# Patient Record
Sex: Female | Born: 1990 | Race: Asian | Hispanic: No | Smoking: Former smoker
Health system: Southern US, Community
[De-identification: ages and names within clinical notes are randomized; demographics above are authoritative.]

## PROBLEM LIST (undated history)

## (undated) ENCOUNTER — Emergency Department (HOSPITAL_COMMUNITY): Discharge status: Absent without leave | Payer: Self-pay

## (undated) DIAGNOSIS — F332 Major depressive disorder, recurrent severe without psychotic features: Secondary | ICD-10-CM

## (undated) DIAGNOSIS — O24419 Gestational diabetes mellitus in pregnancy, unspecified control: Secondary | ICD-10-CM

## (undated) DIAGNOSIS — R569 Unspecified convulsions: Secondary | ICD-10-CM

## (undated) DIAGNOSIS — N809 Endometriosis, unspecified: Secondary | ICD-10-CM

## (undated) DIAGNOSIS — F1910 Other psychoactive substance abuse, uncomplicated: Secondary | ICD-10-CM

## (undated) DIAGNOSIS — F17200 Nicotine dependence, unspecified, uncomplicated: Secondary | ICD-10-CM

## (undated) DIAGNOSIS — F32A Depression, unspecified: Secondary | ICD-10-CM

## (undated) DIAGNOSIS — F419 Anxiety disorder, unspecified: Secondary | ICD-10-CM

## (undated) DIAGNOSIS — O139 Gestational [pregnancy-induced] hypertension without significant proteinuria, unspecified trimester: Secondary | ICD-10-CM

## (undated) DIAGNOSIS — F329 Major depressive disorder, single episode, unspecified: Secondary | ICD-10-CM

## (undated) HISTORY — DX: Gestational (pregnancy-induced) hypertension without significant proteinuria, unspecified trimester: O13.9

## (undated) HISTORY — DX: Gestational diabetes mellitus in pregnancy, unspecified control: O24.419

## (undated) HISTORY — DX: Major depressive disorder, recurrent severe without psychotic features: F33.2

---
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
## 2011-12-07 ENCOUNTER — Ambulatory Visit
Admission: RE | Admit: 2011-12-07 | Discharge: 2011-12-07 | Disposition: A | Discharge status: Home / Self Care | Payer: BC Managed Care – PPO | Source: Ambulatory Visit | Attending: Family Medicine | Admitting: Family Medicine

## 2011-12-07 ENCOUNTER — Other Ambulatory Visit: Payer: Self-pay | Admitting: Family Medicine

## 2011-12-08 ENCOUNTER — Emergency Department (HOSPITAL_COMMUNITY)
Admission: EM | Admit: 2011-12-08 | Discharge: 2011-12-09 | Disposition: A | Discharge status: Home / Self Care | Payer: BC Managed Care – PPO | Attending: Emergency Medicine | Admitting: Emergency Medicine

## 2011-12-08 ENCOUNTER — Encounter (HOSPITAL_COMMUNITY): Payer: Self-pay | Admitting: Emergency Medicine

## 2011-12-08 DIAGNOSIS — R109 Unspecified abdominal pain: Secondary | ICD-10-CM | POA: Insufficient documentation

## 2011-12-08 DIAGNOSIS — A499 Bacterial infection, unspecified: Secondary | ICD-10-CM | POA: Insufficient documentation

## 2011-12-08 DIAGNOSIS — B9689 Other specified bacterial agents as the cause of diseases classified elsewhere: Secondary | ICD-10-CM | POA: Insufficient documentation

## 2011-12-08 DIAGNOSIS — N949 Unspecified condition associated with female genital organs and menstrual cycle: Secondary | ICD-10-CM | POA: Insufficient documentation

## 2011-12-08 DIAGNOSIS — F17200 Nicotine dependence, unspecified, uncomplicated: Secondary | ICD-10-CM | POA: Insufficient documentation

## 2011-12-08 DIAGNOSIS — N760 Acute vaginitis: Secondary | ICD-10-CM | POA: Insufficient documentation

## 2011-12-08 DIAGNOSIS — J45909 Unspecified asthma, uncomplicated: Secondary | ICD-10-CM | POA: Insufficient documentation

## 2011-12-08 DIAGNOSIS — Z79899 Other long term (current) drug therapy: Secondary | ICD-10-CM | POA: Insufficient documentation

## 2011-12-08 LAB — URINALYSIS, ROUTINE W REFLEX MICROSCOPIC
Bilirubin Urine: NEGATIVE
Glucose, UA: NEGATIVE mg/dL
Specific Gravity, Urine: 1.018 (ref 1.005–1.030)
pH: 8 (ref 5.0–8.0)

## 2011-12-08 LAB — BASIC METABOLIC PANEL
BUN: 10 mg/dL (ref 6–23)
GFR calc non Af Amer: >90 mL/min (ref >90)
Glucose, Bld: 87 mg/dL (ref 70–99)
Potassium: 3.6 mEq/L (ref 3.5–5.1)

## 2011-12-08 LAB — DIFFERENTIAL
Eosinophils Absolute: 0.5 10*3/uL (ref 0.0–0.7)
Eosinophils Relative: 5 % (ref 0–5)
Lymphs Abs: 4 10*3/uL (ref 0.7–4.0)
Monocytes Absolute: 0.6 10*3/uL (ref 0.1–1.0)
Monocytes Relative: 6 % (ref 3–12)
Neutrophils Relative %: 45 % (ref 43–77)

## 2011-12-08 LAB — URINE MICROSCOPIC-ADD ON

## 2011-12-08 LAB — CBC
HCT: 39.3 % (ref 36.0–46.0)
Hemoglobin: 12.7 g/dL (ref 12.0–15.0)
MCH: 26 pg (ref 26.0–34.0)
MCV: 80.4 fL (ref 78.0–100.0)
RBC: 4.89 MIL/uL (ref 3.87–5.11)

## 2011-12-08 NOTE — ED Notes (Signed)
PT. REPORTS LOW ABDOMINAL PAIN FOR 3 WEEKS WITH INTERMITTENT VOMITTING AND DIARRHEA , ULTRASOUND DONE 2 DAYS AGO AT [facility] WITH NO SIGNIFICANT FINDINGS , ALSO REPORTS TOOK SIX TRAMADOL TABS THIS EVENING WITH NO RELIEF.

## 2011-12-08 NOTE — ED Notes (Signed)
Patient moved to cdu 11  for pelvic exam.

## 2011-12-09 ENCOUNTER — Emergency Department (HOSPITAL_COMMUNITY): Payer: BC Managed Care – PPO

## 2011-12-09 LAB — WET PREP, GENITAL
Trich, Wet Prep: NONE SEEN
Yeast Wet Prep HPF POC: NONE SEEN

## 2011-12-09 MED ORDER — METRONIDAZOLE 500 MG PO TABS: 500.0000 mg | ORAL_TABLET | Freq: Two times a day (BID) | ORAL | Status: AC

## 2011-12-09 MED ORDER — ONDANSETRON HCL 4 MG/2ML IJ SOLN
4.0000 mg | Freq: Once | INTRAMUSCULAR | Status: AC
  Administered 2011-12-09: 4 mg via INTRAVENOUS
  Filled 2011-12-09: qty 2

## 2011-12-09 NOTE — Discharge Instructions (Signed)
Please review the instructions below. Your blood work, urinalysis and a pelvic ultrasound tonight were all normal. The test performed during the pelvic exam does reveal that you have a bacterial vaginosis. We are treating this infection with an antibiotic, please take as directed and be sure complete. You'll need to arrange follow up with Dr. Hyacinth Gaines for further evaluation of this persistent lower abdominal pain. Be sure to let her know you have seen in the emergency department and had the pelvic ultrasound which was without acute findings. Return for worsening symptoms otherwise follow up as discussed.  Abdominal Pain Many things can cause belly (abdominal) pain. Most times, the belly pain is not dangerous. The amount of belly pain does not tell how serious the problem may be. Many cases of belly pain can be watched and treated at home. HOME CARE   Do not take medicines that help you go poop (laxatives) unless told to by your doctor.   Only take medicine as told by your doctor.   Eat or drink as told by your doctor. Your doctor will tell you if you should be on a special diet.  GET HELP RIGHT AWAY IF:   The pain does not go away.   You have a fever.   You keep throwing up (vomiting).   The pain changes and is only in the right or left part of the belly.   You have bloody or tarry looking poop.  MAKE SURE YOU:   Understand these instructions.   Will watch your condition.   Will get help right away if you are not doing well or get worse.  Document Released: 03/07/2008 Document Revised: 09/08/2011 Document Reviewed: 10/05/2009 [facility] Patient Information 2012 [place], [place].Bacterial Vaginosis Bacterial vaginosis is an infection of the vagina. A healthy vagina has many kinds of good germs (bacteria). Sometimes the number of good germs can change. This allows bad germs to move in and cause an infection. You may be given medicine (antibiotics) to treat the infection. Or, you may not need  treatment at all. HOME CARE  Take your medicine as told. Finish them even if you start to feel better.   Do not have sex until you finish your medicine.   Do not douche.   Practice safe sex.   Tell your sex partner that you have an infection. They should see their doctor for treatment if they have problems.  GET HELP RIGHT AWAY IF:  You do not get better after 3 days of treatment.   You have grey fluid (discharge) coming from your vagina.   You have pain.   You have a temperature of 102 F (38.9 C) or higher.  MAKE SURE YOU:   Understand these instructions.   Will watch your condition.   Will get help right away if you are not doing well or get worse.  Document Released: 06/28/2008 Document Revised: 09/08/2011 Document Reviewed: 06/28/2008 [facility] Patient Information 2012 [place], [place].Bacterial Vaginosis Bacterial vaginosis is an infection of the vagina. A healthy vagina has many kinds of good germs (bacteria). Sometimes the number of good germs can change. This allows bad germs to move in and cause an infection. You may be given medicine (antibiotics) to treat the infection. Or, you may not need treatment at all. HOME CARE  Take your medicine as told. Finish them even if you start to feel better.   Do not have sex until you finish your medicine.   Do not douche.   Practice safe sex.  Tell your sex partner that you have an infection. They should see their doctor for treatment if they have problems.  GET HELP RIGHT AWAY IF:  You do not get better after 3 days of treatment.   You have grey fluid (discharge) coming from your vagina.   You have pain.   You have a temperature of 102 F (38.9 C) or higher.  MAKE SURE YOU:   Understand these instructions.   Will watch your condition.   Will get help right away if you are not doing well or get worse.  Document Released: 06/28/2008 Document Revised: 09/08/2011 Document Reviewed: 06/28/2008 [facility] Patient  Information 2012 [place], [place].

## 2011-12-09 NOTE — ED Provider Notes (Signed)
History     CSN: [id number]  Arrival date & time 12/08/11  2055   First MD Initiated Contact with Patient 12/08/11 2210      Chief Complaint  Patient presents with  . Abdominal Pain     Patient is a 21 y.o. female presenting with abdominal pain. The history is provided by the patient.  Abdominal Pain The primary symptoms of the illness include abdominal pain. The primary symptoms of the illness do not include fever, shortness of breath, nausea, vomiting, diarrhea, hematemesis, hematochezia, dysuria, vaginal discharge or vaginal bleeding. The current episode started more than 2 days ago. The onset of the illness was gradual. The problem has been gradually worsening.  The patient states that she believes she is currently not pregnant. The patient has not had a change in bowel habit. Symptoms associated with the illness do not include chills, anorexia, constipation, urgency, frequency or back pain.   patient reports to 3 week history of lower abdominal pain. States the pain is intermittent and severe at times. States the worst pain is in the suprapubic area but then she has sharp pains that radiate up to her mid abdomen. Denies nausea, vomiting, diarrhea, fever, vaginal discharge or urinary tract symptoms. Was evaluated by her PCP recently for this complaint and she states it were an abdominal ultrasound which was normal. Patient states she is unsure why they ordered an abdominal ultrasound when her pain has clearly been lower abdomen/pelvic area. Patient admits to being sexually active with one partner, and intercourse is often a protected. States she is on oral contraceptives.   Past Medical History  Diagnosis Date  . Asthma     History reviewed. No pertinent past surgical history.  No family history on file.  History  Substance Use Topics  . Smoking status: Current Everyday Smoker  . Smokeless tobacco: Not on file  . Alcohol Use: No    OB History    Grav Para Term Preterm  Abortions TAB SAB Ect Mult Living                  Review of Systems  Constitutional: Negative.  Negative for fever and chills.  HENT: Negative.   Eyes: Negative.   Respiratory: Negative.  Negative for shortness of breath.   Cardiovascular: Negative.   Gastrointestinal: Positive for abdominal pain. Negative for nausea, vomiting, diarrhea, constipation, hematochezia, anorexia and hematemesis.  Genitourinary: Negative.  Negative for dysuria, urgency, frequency, vaginal bleeding and vaginal discharge.  Musculoskeletal: Negative.  Negative for back pain.  Skin: Negative.   Neurological: Negative.   Hematological: Negative.   Psychiatric/Behavioral: Negative.     Allergies  Albuterol  Home Medications   Current Outpatient Rx  Name Route Sig Dispense Refill  . TRAMADOL HCL 50 MG PO TABS Oral Take 50-100 mg by mouth every 8 (eight) hours as needed. For pain    . METRONIDAZOLE 500 MG PO TABS Oral Take 1 tablet (500 mg total) by mouth 2 (two) times daily. 14 tablet 0    BP 102/57  Pulse 65  Temp(Src) 98.3 F (36.8 C) (Oral)  Resp 18  SpO2 98%  LMP 12/04/2011  Physical Exam  Constitutional: She is oriented to person, place, and time. She appears well-developed and well-nourished.  HENT:  Head: Normocephalic and atraumatic.  Eyes: Conjunctivae are normal.  Neck: Neck supple.  Cardiovascular: Normal rate and regular rhythm.   Pulmonary/Chest: Effort normal and breath sounds normal.  Abdominal: Soft. Bowel sounds are normal. Hernia confirmed negative  in the right inguinal area and confirmed negative in the left inguinal area.  Genitourinary: Vagina normal and uterus normal. There is no rash or tenderness on the right labia. There is no rash or tenderness on the left labia. Cervix exhibits no motion tenderness, no discharge and no friability. Right adnexum displays no mass, no tenderness and no fullness. Left adnexum displays no mass, no tenderness and no fullness.       Mild (L)  adnexal TTP, no fullness or mass. Creamy white vag d/c.  Musculoskeletal: Normal range of motion.  Lymphadenopathy:       Right: No inguinal adenopathy present.       Left: No inguinal adenopathy present.  Neurological: She is alert and oriented to person, place, and time.  Skin: Skin is warm and dry. No erythema.  Psychiatric: She has a normal mood and affect.    ED Course  Procedures   Patient noted to be sleeping upon reassessment. Findings and clinical impression discussed with patient. Will plan to treat for BV and encourage close follow up with her PCP for further evaluation of persistent lower abdominal/pelvic pain. Pt is agreeable w/ plan.  Labs Reviewed  URINALYSIS, ROUTINE W REFLEX MICROSCOPIC - Abnormal; Notable for the following:    APPearance TURBID (*)    Hgb urine dipstick SMALL (*)    Leukocytes, UA TRACE (*)    All other components within normal limits  URINE MICROSCOPIC-ADD ON - Abnormal; Notable for the following:    Squamous Epithelial / LPF FEW (*)    Bacteria, UA FEW (*)    All other components within normal limits  WET PREP, GENITAL - Abnormal; Notable for the following:    Clue Cells Wet Prep HPF POC FEW (*)    WBC, Wet Prep HPF POC MANY (*)    All other components within normal limits  CBC  DIFFERENTIAL  BASIC METABOLIC PANEL  POCT PREGNANCY, URINE  GC/CHLAMYDIA PROBE AMP, GENITAL   US Abdomen Complete  12/07/2011  *RADIOLOGY REPORT*  Clinical Data:  Abdominal pain for 3 weeks, smoking history  COMPLETE ABDOMINAL ULTRASOUND  Comparison:  None.  Findings:  Gallbladder:  The gallbladder is visualized and no gallstones are noted.  There is no pain over the gallbladder with compression.  Common bile duct:  The common bile duct is normal measuring 2.3 mm in diameter.  Liver:  The liver has a normal echogenic pattern.  No ductal dilatation is seen.  IVC:  Appears normal.  Pancreas:  No focal abnormality seen.  Spleen:  The spleen is normal measuring 6.4 cm  sagittally.  Right Kidney:  No hydronephrosis is seen.  The right kidney measures 10.6 cm sagittally.  Left Kidney:  No hydronephrosis.  The left kidney measures 11.6 cm.  Abdominal aorta:  The abdominal aorta is normal in caliber.  IMPRESSION: Negative abdominal ultrasound.  No gallstones.  No ductal dilatation.  Original Report Authenticated By: Juline Patch, M.D.   US Transvaginal Non-ob  12/09/2011  *RADIOLOGY REPORT*  Clinical Data: Abdominal pain  TRANSABDOMINAL AND TRANSVAGINAL ULTRASOUND OF PELVIS Technique:  Both transabdominal and transvaginal ultrasound examinations of the pelvis were performed. Transabdominal technique was performed for global imaging of the pelvis including uterus, ovaries, adnexal regions, and pelvic cul-de-sac.  Comparison: None   It was necessary to proceed with endovaginal exam following the transabdominal exam to visualize the endometrium and ovaries.  Findings:  Uterus: 6.2 cm length by 3.3 cm AP by 4.4 cm transverse.  Appears anteflexed.  No gross mass identified.  Endometrium: 3 mm thick, normal.  No endometrial fluid.  Right ovary:  5.4 x 2.7 x 2.7 cm.  Normal morphology without mass.  Left ovary: 4.6 x 2.3 x 3.4 cm.  Normal morphology without mass.  Other findings: Small to moderate amount of simple appearing free pelvic fluid.  No adnexal masses.  IMPRESSION: Anteflexed uterus. Small to moderate amount of simple appearing free pelvic fluid. No additional intrapelvic abnormalities identified.  Original Report Authenticated By: Lollie Marrow, M.D.   US Pelvis Complete  12/09/2011  *RADIOLOGY REPORT*  Clinical Data: Abdominal pain  TRANSABDOMINAL AND TRANSVAGINAL ULTRASOUND OF PELVIS Technique:  Both transabdominal and transvaginal ultrasound examinations of the pelvis were performed. Transabdominal technique was performed for global imaging of the pelvis including uterus, ovaries, adnexal regions, and pelvic cul-de-sac.  Comparison: None   It was necessary to proceed with  endovaginal exam following the transabdominal exam to visualize the endometrium and ovaries.  Findings:  Uterus: 6.2 cm length by 3.3 cm AP by 4.4 cm transverse.  Appears anteflexed.  No gross mass identified.  Endometrium: 3 mm thick, normal.  No endometrial fluid.  Right ovary:  5.4 x 2.7 x 2.7 cm.  Normal morphology without mass.  Left ovary: 4.6 x 2.3 x 3.4 cm.  Normal morphology without mass.  Other findings: Small to moderate amount of simple appearing free pelvic fluid.  No adnexal masses.  IMPRESSION: Anteflexed uterus. Small to moderate amount of simple appearing free pelvic fluid. No additional intrapelvic abnormalities identified.  Original Report Authenticated By: Lollie Marrow, M.D.     1. Bacterial vaginosis   2. Abdominal pain       MDM  HPI/PE and clinical findings c/w 1. Abd pain (PE unremarkable, never observed in any acute distress, abd exam unremarkable. Mild SP TTP. All labs and pelvic u/s normal. Pelvic (wet prep) shows BV but otherwise unremarkable.  2. Bacterial Vaginosis        Roma Kayser Kinsey Karch, NP 12/09/11 [phone]

## 2011-12-09 NOTE — ED Notes (Signed)
Called [place].  They will be coming to transport pt for pelvic US in 15 minutes.

## 2011-12-10 LAB — GC/CHLAMYDIA PROBE AMP, GENITAL
Chlamydia, DNA Probe: POSITIVE — AB
GC Probe Amp, Genital: NEGATIVE

## 2011-12-11 NOTE — ED Notes (Signed)
+  Chlamydia Chart sent to EDP office for review.  

## 2011-12-12 NOTE — ED Notes (Signed)
Rx for Zithromax 1 gram po (tablets ok per Lyman Bishop.

## 2011-12-13 NOTE — ED Notes (Addendum)
Patient returned call and was informed of positive results after id'd x 2 and informed of need to notify partner to be treated. Rx called to [facility] by [facility] letter faxed.

## 2011-12-13 NOTE — ED Notes (Signed)
No answer

## 2011-12-29 NOTE — ED Provider Notes (Signed)
Evaluation and management procedures were performed by the PA/NP under my supervision/collaboration.   Felisa Bonier, MD 12/29/11 1009

## 2014-08-25 ENCOUNTER — Emergency Department (HOSPITAL_COMMUNITY)
Admission: EM | Admit: 2014-08-25 | Discharge: 2014-08-25 | Disposition: A | Discharge status: Home / Self Care | Payer: Self-pay | Attending: Emergency Medicine | Admitting: Emergency Medicine

## 2014-08-25 ENCOUNTER — Encounter (HOSPITAL_COMMUNITY): Payer: Self-pay | Admitting: Emergency Medicine

## 2014-08-25 DIAGNOSIS — Z720 Tobacco use: Secondary | ICD-10-CM | POA: Insufficient documentation

## 2014-08-25 DIAGNOSIS — R0682 Tachypnea, not elsewhere classified: Secondary | ICD-10-CM | POA: Insufficient documentation

## 2014-08-25 DIAGNOSIS — Z3202 Encounter for pregnancy test, result negative: Secondary | ICD-10-CM | POA: Insufficient documentation

## 2014-08-25 DIAGNOSIS — F419 Anxiety disorder, unspecified: Secondary | ICD-10-CM | POA: Insufficient documentation

## 2014-08-25 LAB — URINALYSIS, ROUTINE W REFLEX MICROSCOPIC
Bilirubin Urine: NEGATIVE
Glucose, UA: NEGATIVE mg/dL
Hgb urine dipstick: NEGATIVE
Ketones, ur: NEGATIVE mg/dL
Leukocytes, UA: NEGATIVE
Nitrite: NEGATIVE
Protein, ur: NEGATIVE mg/dL
Specific Gravity, Urine: 1.011 (ref 1.005–1.030)
Urobilinogen, UA: 0.2 mg/dL (ref 0.0–1.0)
pH: 8 (ref 5.0–8.0)

## 2014-08-25 LAB — PREGNANCY, URINE: Preg Test, Ur: NEGATIVE

## 2014-08-25 LAB — CBG MONITORING, ED: Glucose-Capillary: 78 mg/dL (ref 70–99)

## 2014-08-25 MED ORDER — LORAZEPAM 1 MG PO TABS
1.0000 mg | ORAL_TABLET | Freq: Once | ORAL | Status: AC
  Administered 2014-08-25: 1 mg via ORAL
  Filled 2014-08-25: qty 1

## 2014-08-25 MED ORDER — ONDANSETRON 4 MG PO TBDP
4.0000 mg | ORAL_TABLET | Freq: Once | ORAL | Status: AC
  Administered 2014-08-25: 4 mg via ORAL
  Filled 2014-08-25: qty 1

## 2014-08-25 NOTE — ED Notes (Signed)
Pt in room making self hyperventilate, vitals WDL. Pt told to take deep breathes in nose and out of mouth. Oxygen given for comfort.

## 2014-08-25 NOTE — ED Notes (Signed)
Pt states she feels like she is "going to have a seizure." Pt hyperventilating and sweating with extremity tingling. Pt states if she holds her breath sometimes the feeling goes away. Reports hx of seizures, has never been prescribed medication for seizures. Feeling began while at work today, denies any upsetting situation at work.

## 2014-08-25 NOTE — Discharge Instructions (Signed)

## 2014-09-03 NOTE — ED Provider Notes (Signed)
CSN: [id number]     Arrival date & time 08/25/14  1239 History   First MD Initiated Contact with Patient 08/25/14 1310     Chief Complaint  Patient presents with  . Tingling    feels like she's going to have a seizure     (Consider location/radiation/quality/duration/timing/severity/associated sxs/prior Treatment) HPI   23yF with feeling that "I'm going to have a seizure." Says she has had seizures before, but not on meds. Can't recall last time she had one. Onset of symptoms just before arrival while at work. Feel anxious/nervous. Hands numb. Denies pain. No SOB. No fever or chills. Denies acute stressors.   History reviewed. No pertinent past medical history. History reviewed. No pertinent past surgical history. History reviewed. No pertinent family history. History  Substance Use Topics  . Smoking status: Current Every Day Smoker  . Smokeless tobacco: Not on file  . Alcohol Use: No   OB History    No data available     Review of Systems  All systems reviewed and negative, other than as noted in HPI.   Allergies  Review of patient's allergies indicates no known allergies.  Home Medications   Prior to Admission medications   Medication Sig Start Date End Date Taking? Authorizing Provider  ibuprofen (ADVIL,MOTRIN) 200 MG tablet Take 200-400 mg by mouth every 6 (six) hours as needed for mild pain, moderate pain or cramping.   Yes Historical Provider, MD   BP 89/45 mmHg  Pulse 54  Temp(Src) 97.7 F (36.5 C) (Oral)  Resp 18  SpO2 98% Physical Exam  Constitutional: She is oriented to person, place, and time. She appears well-developed and well-nourished. No distress.  HENT:  Head: Normocephalic and atraumatic.  Eyes: Conjunctivae are normal. Right eye exhibits no discharge. Left eye exhibits no discharge.  Neck: Neck supple.  Cardiovascular: Normal rate, regular rhythm and normal heart sounds.  Exam reveals no gallop and no friction rub.   No murmur  heard. Pulmonary/Chest: Breath sounds normal.  Very tachypneic. When answering questions her breathing slows though and she can get out several sentences w/o pause.   Abdominal: Soft. She exhibits no distension. There is no tenderness.  Musculoskeletal: She exhibits no edema or tenderness.  Lower extremities symmetric as compared to each other. No calf tenderness. Negative Homan's. No palpable cords.   Neurological: She is alert and oriented to person, place, and time. No cranial nerve deficit. She exhibits normal muscle tone. Coordination normal.  Skin: Skin is warm and dry.  Psychiatric: She has a normal mood and affect. Her behavior is normal. Thought content normal.  Nursing note and vitals reviewed.   ED Course  Procedures (including critical care time) Labs Review Labs Reviewed  URINALYSIS, ROUTINE W REFLEX MICROSCOPIC  PREGNANCY, URINE  CBG MONITORING, ED    Imaging Review No results found.   EKG Interpretation   Date/Time:  Monday August 25 2014 14:00:41 EST Ventricular Rate:  55 PR Interval:  157 QRS Duration: 75 QT Interval:  461 QTC Calculation: 441 R Axis:   84 Text Interpretation:  Sinus rhythm ED PHYSICIAN INTERPRETATION AVAILABLE  IN [facility] Confirmed by TEST, Record ([id number]) on 08/27/2014 8:05:00  AM      MDM   Final diagnoses:  Anxiety    23yF with likely anxiety. Low suspicion for emergent pathology. NOnfocal neuro exam.     Raeford RazorStephen Danaly Bari, MD 09/03/14 1114

## 2015-04-15 ENCOUNTER — Encounter (HOSPITAL_COMMUNITY): Payer: Self-pay | Admitting: Emergency Medicine

## 2015-04-15 ENCOUNTER — Emergency Department (HOSPITAL_COMMUNITY)
Admission: EM | Admit: 2015-04-15 | Discharge: 2015-04-16 | Disposition: A | Discharge status: Home / Self Care | Payer: Self-pay | Attending: Emergency Medicine | Admitting: Emergency Medicine

## 2015-04-15 DIAGNOSIS — F1310 Sedative, hypnotic or anxiolytic abuse, uncomplicated: Secondary | ICD-10-CM | POA: Insufficient documentation

## 2015-04-15 DIAGNOSIS — Z8742 Personal history of other diseases of the female genital tract: Secondary | ICD-10-CM | POA: Insufficient documentation

## 2015-04-15 DIAGNOSIS — F329 Major depressive disorder, single episode, unspecified: Secondary | ICD-10-CM | POA: Insufficient documentation

## 2015-04-15 DIAGNOSIS — Z720 Tobacco use: Secondary | ICD-10-CM | POA: Insufficient documentation

## 2015-04-15 DIAGNOSIS — F419 Anxiety disorder, unspecified: Secondary | ICD-10-CM | POA: Insufficient documentation

## 2015-04-15 DIAGNOSIS — F32A Depression, unspecified: Secondary | ICD-10-CM

## 2015-04-15 DIAGNOSIS — F1210 Cannabis abuse, uncomplicated: Secondary | ICD-10-CM | POA: Insufficient documentation

## 2015-04-15 DIAGNOSIS — J45909 Unspecified asthma, uncomplicated: Secondary | ICD-10-CM | POA: Insufficient documentation

## 2015-04-15 HISTORY — DX: Endometriosis, unspecified: N80.9

## 2015-04-15 LAB — CBC WITH DIFFERENTIAL/PLATELET
BASOS ABS: 0 10*3/uL (ref 0.0–0.1)
Basophils Relative: 0 % (ref 0–1)
Eosinophils Absolute: 0.1 10*3/uL (ref 0.0–0.7)
Eosinophils Relative: 1 % (ref 0–5)
HCT: 38.8 % (ref 36.0–46.0)
HEMOGLOBIN: 12.9 g/dL (ref 12.0–15.0)
LYMPHS ABS: 3.8 10*3/uL (ref 0.7–4.0)
Lymphocytes Relative: 42 % (ref 12–46)
MCH: 26.7 pg (ref 26.0–34.0)
MCHC: 33.2 g/dL (ref 30.0–36.0)
MCV: 80.2 fL (ref 78.0–100.0)
MONOS PCT: 7 % (ref 3–12)
Monocytes Absolute: 0.6 10*3/uL (ref 0.1–1.0)
Neutro Abs: 4.4 10*3/uL (ref 1.7–7.7)
Neutrophils Relative %: 50 % (ref 43–77)
PLATELETS: 272 10*3/uL (ref 150–400)
RBC: 4.84 MIL/uL (ref 3.87–5.11)
RDW: 12.7 % (ref 11.5–15.5)
WBC: 8.9 10*3/uL (ref 4.0–10.5)

## 2015-04-15 LAB — RAPID URINE DRUG SCREEN, HOSP PERFORMED
Amphetamines: NOT DETECTED
BENZODIAZEPINES: POSITIVE — AB
Barbiturates: NOT DETECTED
Cocaine: NOT DETECTED
OPIATES: NOT DETECTED
Tetrahydrocannabinol: POSITIVE — AB

## 2015-04-15 LAB — COMPREHENSIVE METABOLIC PANEL
ALBUMIN: 4.1 g/dL (ref 3.5–5.0)
ALK PHOS: 40 U/L (ref 38–126)
ALT: 12 U/L — AB (ref 14–54)
ANION GAP: 7 (ref 5–15)
AST: 16 U/L (ref 15–41)
BUN: 14 mg/dL (ref 6–20)
CALCIUM: 9 mg/dL (ref 8.9–10.3)
CHLORIDE: 106 mmol/L (ref 101–111)
CO2: 23 mmol/L (ref 22–32)
CREATININE: 0.89 mg/dL (ref 0.44–1.00)
GFR calc Af Amer: >60 mL/min (ref >60)
Glucose, Bld: 129 mg/dL — ABNORMAL HIGH (ref 65–99)
Potassium: 3.1 mmol/L — ABNORMAL LOW (ref 3.5–5.1)
Sodium: 136 mmol/L (ref 135–145)
Total Bilirubin: 1 mg/dL (ref 0.3–1.2)
Total Protein: 6.8 g/dL (ref 6.5–8.1)

## 2015-04-15 LAB — ETHANOL

## 2015-04-15 MED ORDER — KETOROLAC TROMETHAMINE 30 MG/ML IJ SOLN
30.0000 mg | Freq: Once | INTRAMUSCULAR | Status: AC
  Administered 2015-04-15: 30 mg via INTRAVENOUS
  Filled 2015-04-15: qty 1

## 2015-04-15 MED ORDER — SODIUM CHLORIDE 0.9 % IV SOLN
1000.0000 mL | Freq: Once | INTRAVENOUS | Status: AC
  Administered 2015-04-15: 1000 mL via INTRAVENOUS

## 2015-04-15 MED ORDER — DIPHENHYDRAMINE HCL 50 MG/ML IJ SOLN
25.0000 mg | Freq: Once | INTRAMUSCULAR | Status: AC
  Administered 2015-04-15: 25 mg via INTRAVENOUS
  Filled 2015-04-15: qty 1

## 2015-04-15 MED ORDER — SODIUM CHLORIDE 0.9 % IV BOLUS (SEPSIS)
1000.0000 mL | Freq: Once | INTRAVENOUS | Status: AC
  Administered 2015-04-15: 1000 mL via INTRAVENOUS

## 2015-04-15 MED ORDER — SODIUM CHLORIDE 0.9 % IV SOLN
1000.0000 mL | INTRAVENOUS | Status: DC
  Administered 2015-04-15: 1000 mL via INTRAVENOUS

## 2015-04-15 MED ORDER — ALPRAZOLAM 0.25 MG PO TABS
2.0000 mg | ORAL_TABLET | Freq: Once | ORAL | Status: AC
Start: 2015-04-15 — End: 2015-04-15
  Administered 2015-04-15: 2 mg via ORAL
  Filled 2015-04-15: qty 8

## 2015-04-15 NOTE — ED Notes (Signed)
Pt continuing to complain of "shaky legs", "feeling cold" and "hurting all over".  MD made aware.  This RN did comforting measures with pt, including warm blanket, therapeutic conversation, and education.  Pt verbalizes understanding but it frustrated and stating she is considering leaving. This RN continued to encourage pt to stay strong and allow us to care for her.  Pt willing to stay at this time.

## 2015-04-15 NOTE — ED Notes (Signed)
MD at bedside. 

## 2015-04-15 NOTE — ED Notes (Signed)
Pt again threatening to leave.  Pt sts "it's too cold".  This RN has explained that patient cannot go outside while under our care.  Offered another warm blanket then MD arrived.

## 2015-04-15 NOTE — ED Notes (Signed)
Pt requesting more pain medications and "something to make me sleep".  This RN attempted to explain protocols and vital sign monitoring.  Pt unhappy with this RN's responses and requesting MD to see her.  MD made aware.

## 2015-04-15 NOTE — ED Provider Notes (Signed)
CSN: [id number]     Arrival date & time 04/15/15  1723 History   First MD Initiated Contact with Patient 04/15/15 1903     Chief Complaint  Patient presents with  . Anxiety  . Generalized Body Aches     (Consider location/radiation/quality/duration/timing/severity/associated sxs/prior Treatment) Patient is a 24 y.o. female presenting with anxiety. The history is provided by the patient.  Anxiety  She is a very poor historian but comes here mainly for help with detoxing off of Xanax. She states that she is taking 5 or 6 bars at day and has been doing so for the last several weeks. Once before, she had a seizure when she stopped the Xanax abruptly. She states that she is having a lot of stress related to a divorce and she just once to get detoxed so that she can cut on with her life. She admits to having used cocaine in the past but not recently. She denies any intravenous drug use. She is only a social drinker and states she smokes about half pack of cigarettes a day. She denies depression, suicidal ideation, homicidal ideation, hallucinations. She is having some intermittent numbness and she's worried that she is going to have another seizure. She is also given planning of generalized aching.  Past Medical History  Diagnosis Date  . Asthma   . Endometriosis    History reviewed. No pertinent past surgical history. No family history on file. History  Substance Use Topics  . Smoking status: Current Every Day Smoker -- 0.50 packs/day    Types: Cigarettes  . Smokeless tobacco: Not on file  . Alcohol Use: Yes     Comment: social   OB History    No data available     Review of Systems  All other systems reviewed and are negative.     Allergies  Albuterol  Home Medications   Prior to Admission medications   Medication Sig Start Date End Date Taking? Authorizing Provider  traMADol (ULTRAM) 50 MG tablet Take 50-100 mg by mouth every 8 (eight) hours as needed. For pain     Historical Provider, MD   BP 110/46 mmHg  Pulse 82  Temp(Src) 97.7 F (36.5 C) (Oral)  Resp 22  SpO2 100%  LMP 04/10/2015 Physical Exam  Nursing note and vitals reviewed.  24 year old female, resting comfortably and in no acute distress. Vital signs are significant for mild tachypnea. Oxygen saturation is 100%, which is normal. Head is normocephalic and atraumatic. PERRLA, EOMI. Oropharynx is clear. Neck is nontender and supple without adenopathy or JVD. Back is nontender and there is no CVA tenderness. Lungs are clear without rales, wheezes, or rhonchi. Chest is nontender. Heart has regular rate and rhythm without murmur. Abdomen is soft, flat, nontender without masses or hepatosplenomegaly and peristalsis is normoactive. Extremities have no cyanosis or edema, full range of motion is present. Skin is warm and dry without rash. Neurologic: Speech is normal but affect is somewhat flat, cranial nerves are intact, there are no motor or sensory deficits.  ED Course  Procedures (including critical care time) Labs Review Results for orders placed or performed during the hospital encounter of 12/08/11  Wet prep, genital  Result Value Ref Range   Yeast Wet Prep HPF POC NONE SEEN NONE SEEN   Trich, Wet Prep NONE SEEN NONE SEEN   Clue Cells Wet Prep HPF POC FEW (A) NONE SEEN   WBC, Wet Prep HPF POC MANY (A) NONE SEEN  Urinalysis, Routine w  reflex microscopic  Result Value Ref Range   Color, Urine YELLOW YELLOW   APPearance TURBID (A) CLEAR   Specific Gravity, Urine 1.018 1.005 - 1.030   pH 8.0 5.0 - 8.0   Glucose, UA NEGATIVE NEGATIVE mg/dL   Hgb urine dipstick SMALL (A) NEGATIVE   Bilirubin Urine NEGATIVE NEGATIVE   Ketones, ur NEGATIVE NEGATIVE mg/dL   Protein, ur NEGATIVE NEGATIVE mg/dL   Urobilinogen, UA 0.2 0.0 - 1.0 mg/dL   Nitrite NEGATIVE NEGATIVE   Leukocytes, UA TRACE (A) NEGATIVE  CBC  Result Value Ref Range   WBC 9.3 4.0 - 10.5 K/uL   RBC 4.89 3.87 - 5.11 MIL/uL    Hemoglobin 12.7 12.0 - 15.0 g/dL   HCT 40.9 81.1 - 91.4 %   MCV 80.4 78.0 - 100.0 fL   MCH 26.0 26.0 - 34.0 pg   MCHC 32.3 30.0 - 36.0 g/dL   RDW 78.2 95.6 - 21.3 %   Platelets 310 150 - 400 K/uL  Differential  Result Value Ref Range   Neutrophils Relative % 45 43 - 77 %   Neutro Abs 4.1 1.7 - 7.7 K/uL   Lymphocytes Relative 43 12 - 46 %   Lymphs Abs 4.0 0.7 - 4.0 K/uL   Monocytes Relative 6 3 - 12 %   Monocytes Absolute 0.6 0.1 - 1.0 K/uL   Eosinophils Relative 5 0 - 5 %   Eosinophils Absolute 0.5 0.0 - 0.7 K/uL   Basophils Relative 0 0 - 1 %   Basophils Absolute 0.0 0.0 - 0.1 K/uL  Basic metabolic panel  Result Value Ref Range   Sodium 140 135 - 145 mEq/L   Potassium 3.6 3.5 - 5.1 mEq/L   Chloride 105 96 - 112 mEq/L   CO2 26 19 - 32 mEq/L   Glucose, Bld 87 70 - 99 mg/dL   BUN 10 6 - 23 mg/dL   Creatinine, Ser 0.86 0.50 - 1.10 mg/dL   Calcium 9.3 8.4 - 57.8 mg/dL   GFR calc non Af Amer >90 >90 mL/min   GFR calc Af Amer >90 >90 mL/min  Urine microscopic-add on  Result Value Ref Range   Squamous Epithelial / LPF FEW (A) RARE   WBC, UA 0-2 <3 WBC/hpf   RBC / HPF 0-2 <3 RBC/hpf   Bacteria, UA FEW (A) RARE   Urine-Other AMORPHOUS URATES/PHOSPHATES   GC/chlamydia probe amp, genital  Result Value Ref Range   GC Probe Amp, Genital NEGATIVE NEGATIVE   Chlamydia, DNA Probe POSITIVE (A) NEGATIVE  Pregnancy, urine POC  Result Value Ref Range   Preg Test, Ur NEGATIVE NEGATIVE    EKG Interpretation   Date/Time:  Wednesday April 15 2015 18:11:55 EDT Ventricular Rate:  61 PR Interval:  138 QRS Duration: 73 QT Interval:  412 QTC Calculation: 415 R Axis:   85 Text Interpretation:  Sinus rhythm Normal ECG No old tracing to compare  Confirmed by 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 Given her report of benzodiazepine  withdrawal seizure in the past, she will need to have her alprazolam weaned off over a 1-2 week period. Screening labs have been obtained.  Drug screen is positive for THC and benzodiazepine consistent with her history. After further discussion with patient, she actually does admit to being depressed and states she needs something  to get her motivated in the morning. She is given referrals to outpatient substance abuse programs and mental health services and is given a prescription for a tapering dose of alprazolam so that she can safely stop taking it. Because of complaints of depression, she is given a prescription for paroxetine. She is to follow up with her PCP in one week. She is advised to never take any medication that is not prescribed by physician, and to only take the medication exactly as prescribed.  Dione Booze, MD 04/16/15 [phone]

## 2015-04-15 NOTE — ED Notes (Signed)
Pt arrived to ED with sister and c/o generalized body aches, shaking and anxiety. Pt stated that she took multiple xanax "and something else I don't know what it was" and stated that it was not her prescriptions. Pt is very anxious and tearful.

## 2015-04-16 MED ORDER — ALPRAZOLAM 2 MG PO TABS: 2.0000 mg | ORAL_TABLET | ORAL | Status: DC

## 2015-04-16 MED ORDER — PAROXETINE HCL 20 MG PO TABS: 20.0000 mg | ORAL_TABLET | ORAL | Status: DC

## 2015-04-16 NOTE — Discharge Instructions (Signed)
Take the alprazolam (Xanax) EXACTLY as prescribed. This is a tapering dose designed to let you stop taking it safely. DO NOT TAKE ANY MEDICATION THAT IS NOT PRESCRIBED BY A DOCTOR!!   Emergency Department Resource Guide  Behavioral Health Resources in the Community: Intensive Outpatient Programs Organization         Address  Phone  Notes  [facility] [address] [place], [place] [phone]   [facility] Outpatient [address], [place], [place] [phone]   [facility] [address] [place], [place]  [phone]   [facility] [address]  [place], [place] [phone] or [phone]   Substance Abuse Resources Organization         Address  Phone  Notes  [facility]  [phone]   [facility]  [phone]   [facility]  [phone]   Floydene Flock  [phone]   Residential [facility] Outpatient Substance Abuse Program  [phone]   Psychological Services Organization         Address  Phone  Notes  [facility]  [phone]   [facility]  [phone]   [facility] [address] [place] [phone] or [phone]    Mobile Crisis Teams Organization         Address  Phone  Notes  [facility], Mobile Crisis Care Unit  [phone]   [facility]  [address] [place], [place] [phone]   Doristine Locks [address] [place] [phone]    Self-Help/Support Groups Organization         Address  Phone             Notes  [facility] [place] - variety of support groups  [phone] Call for more information  Narcotics Anonymous (NA), [facility] [address], [employer] [place]  2 meetings at this location   Statistician         Address  Phone  Notes  [facility] [address],      [place]  [phone]   [facility]  [address] [place] [phone], [place], [place] [phone]   [facility] [address] [place], [place] [phone] Admissions: 8am-3pm M-F  [facility] [address]    [place], [place] [phone]   [facility] [address] Starling Manns [place], [place] [phone]   [facility] [address],  [place], [place] [phone]   [facility] - Intensive Outpatient [address], [place], [place] [phone]   [facility] ([facility] [address]  [place], [place] [phone] or [phone]   [facility] ([facility] [address], [place], [place] [phone] Accepts Medicaid  [facility] [address],  [place] [phone] Substance Abuse/Addiction Treatment   Chemical Dependency Chemical dependency is an addiction to drugs or alcohol. It is characterized by the repeated behavior of seeking out and using drugs and alcohol despite harmful consequences to the health and safety of ones self and others.  RISK FACTORS There are certain situations or behaviors that increase a person's risk for chemical dependency. These include: 1. A family history of chemical dependency. 2. A history of mental health issues, including depression and anxiety. 3. A home environment where drugs and alcohol are easily available to you. 4. Drug or  alcohol use at a young age. SYMPTOMS  The following symptoms can indicate chemical dependency:  Inability to limit the use of drugs or alcohol.  Nausea, sweating, shakiness, and anxiety that occurs when alcohol or drugs are not being used.  An increase in amount of drugs or alcohol that is necessary to get drunk or high. People who experience these symptoms can assess their use of drugs and alcohol by asking themselves the following questions:  Have you been told by friends or  family that they are worried about your use of alcohol or drugs?  Do friends and family ever tell you about things you did while drinking alcohol or using drugs that you do not remember?  Do you lie about using alcohol or drugs or about the amounts you use?  Do you have difficulty completing daily tasks unless you use alcohol or drugs?  Is the level of your work or school performance lower because of your drug or alcohol use?  Do you get sick from using drugs or alcohol but keep using anyway?  Do you feel uncomfortable in social situations unless you use alcohol or drugs?  Do you use drugs or alcohol to help forget problems? An answer of yes to any of these questions may indicate chemical dependency. Professional evaluation is suggested. Document Released: 09/13/2001 Document Revised: 12/12/2011 Document Reviewed: 11/25/2010 [facility] Patient Information 2015 [place], [place]. This information is not intended to replace advice given to you by your health care provider. Make sure you discuss any questions you have with your health care provider.   Benzodiazepine Withdrawal  Benzodiazepines are a group of drugs that are prescribed for both short-term and long-term treatment of a variety of medical conditions. For some of these conditions, such as seizures and sudden and severe muscle spasms, they are used only for a few hours or a few days. For other conditions, such as anxiety, sleep problems, or frequent muscle spasms or to help prevent seizures, they are used for an extended period, usually weeks or months. Benzodiazepines work by changing the way your brain functions. Normally, chemicals in your brain called neurotransmitters send messages between your brain cells. The neurotransmitter that benzodiazepines affect is called gamma-aminobutyric acid (GABA). GABA sends out messages that have a calming effect on many of the functions of your brain. Benzodiazepines make these messages stronger and  increase this calming effect. Short-term use of benzodiazepines usually does not cause problems when you stop taking the drugs. However, if you take benzodiazepines for a long time, your body can adjust to the drug and require more of it to produce the same effect (drug tolerance). Eventually, you can develop physical dependence on benzodiazepines, which is when you experience negative effects if your dosage of benzodiazepines is reduced or stopped too quickly. These negative effects are called symptoms of withdrawal. SYMPTOMS Symptoms of withdrawal may begin anytime within the first 10 days after you stop taking the benzodiazepine. They can last from several weeks up to a few months but usually are the worst between the first 10 to 14 days.  The actual symptoms also vary, depending on the type of benzodiazepine you take. Possible symptoms include: 5. Anxiety. 6. Excitability. 7. Irritability. 8. Depression. 9. Mood swings. 10. Trouble sleeping. 11. Confusion. 12. Uncontrollable shaking (tremors). 13. Muscle weakness. 14. Seizures. DIAGNOSIS To diagnose benzodiazepine withdrawal, your caregiver will examine you for certain signs, such as:  Rapid heartbeat.  Rapid breathing.  Tremors.  High blood pressure.  Fever.  Mood changes.  Your caregiver also may ask the following questions about your use of benzodiazepines:  What type of benzodiazepine did you take?  How much did you take each day?  How long did you take the drug?  When was the last time you took the drug?  Do you take any other drugs?  Have you had alcohol recently?  Have you had a seizure recently?  Have you lost consciousness recently?  Have you had trouble remembering recent events?  Have you had a recent increase in anxiety, irritability, or trouble sleeping? A drug test also may be administered. TREATMENT The treatment for benzodiazepine withdrawal can vary, depending on the type and severity of your  symptoms, what type of benzodiazepine you have been taking, and how long you have been taking the benzodiazepine. Sometimes it is necessary for you to be treated in a hospital, especially if you are at risk of seizures.  Often, treatment includes a prescription for a long-acting benzodiazepine, the dosage of which is reduced slowly over a long period. This period could be several weeks or months. Eventually, your dosage will be reduced to a point that you can stop taking the drug, without experiencing withdrawal symptoms. This is called tapered withdrawal. Occasionally, minor symptoms of withdrawal continue for a few days or weeks after you have completed a tapered withdrawal. SEEK IMMEDIATE MEDICAL CARE IF:  You have a seizure.  You develop a craving for drugs or alcohol.  You begin to experience symptoms of withdrawal during your tapered withdrawal.  You become very confused.  You lose consciousness.  You have trouble breathing.  You think about hurting yourself or someone else. Document Released: 09/08/2011 Document Revised: 12/12/2011 Document Reviewed: 09/08/2011 [facility] Patient Information 2015 [place], [place]. This information is not intended to replace advice given to you by your health care provider. Make sure you discuss any questions you have with your health care provider.  Depression Depression refers to feeling sad, low, down in the dumps, blue, gloomy, or empty. In general, there are two kinds of depression: 15. Normal sadness or normal grief. This kind of depression is one that we all feel from time to time after upsetting life experiences, such as the loss of a job or the ending of a relationship. This kind of depression is considered normal, is short lived, and resolves within a few days to 2 weeks. Depression experienced after the loss of a loved one (bereavement) often lasts longer than 2 weeks but normally gets better with time. 16. Clinical depression. This kind of  depression lasts longer than normal sadness or normal grief or interferes with your ability to function at home, at work, and in school. It also interferes with your personal relationships. It affects almost every aspect of your life. Clinical depression is an illness. Symptoms of depression can also be caused by conditions other than those mentioned above, such as:  Physical illness. Some physical illnesses, including underactive thyroid gland (hypothyroidism), severe anemia, specific types of cancer, diabetes, uncontrolled seizures, heart and lung problems, strokes, and chronic pain are commonly associated with symptoms of depression.  Side effects of some prescription medicine. In some people, certain types of medicine can cause symptoms of depression.  Substance abuse. Abuse of alcohol and illicit drugs can cause symptoms of depression. SYMPTOMS Symptoms of normal sadness and normal grief include the following:  Feeling sad or crying for short periods of time.  Not caring about anything (apathy).  Difficulty sleeping or sleeping too much.  No longer  able to enjoy the things you used to enjoy.  Desire to be by oneself all the time (social isolation).  Lack of energy or motivation.  Difficulty concentrating or remembering.  Change in appetite or weight.  Restlessness or agitation. Symptoms of clinical depression include the same symptoms of normal sadness or normal grief and also the following symptoms:  Feeling sad or crying all the time.  Feelings of guilt or worthlessness.  Feelings of hopelessness or helplessness.  Thoughts of suicide or the desire to harm yourself (suicidal ideation).  Loss of touch with reality (psychotic symptoms). Seeing or hearing things that are not real (hallucinations) or having false beliefs about your life or the people around you (delusions and paranoia). DIAGNOSIS  The diagnosis of clinical depression is usually based on how bad the symptoms  are and how long they have lasted. Your health care provider will also ask you questions about your medical history and substance use to find out if physical illness, use of prescription medicine, or substance abuse is causing your depression. Your health care provider may also order blood tests. TREATMENT  Often, normal sadness and normal grief do not require treatment. However, sometimes antidepressant medicine is given for bereavement to ease the depressive symptoms until they resolve. The treatment for clinical depression depends on how bad the symptoms are but often includes antidepressant medicine, counseling with a mental health professional, or both. Your health care provider will help to determine what treatment is best for you. Depression caused by physical illness usually goes away with appropriate medical treatment of the illness. If prescription medicine is causing depression, talk with your health care provider about stopping the medicine, decreasing the dose, or changing to another medicine. Depression caused by the abuse of alcohol or illicit drugs goes away when you stop using these substances. Some adults need professional help in order to stop drinking or using drugs. SEEK IMMEDIATE MEDICAL CARE IF:  You have thoughts about hurting yourself or others.  You lose touch with reality (have psychotic symptoms).  You are taking medicine for depression and have a serious side effect. FOR MORE INFORMATION  National Alliance on Mental Illness: www.nami.[employer] of Mental Health: http://www.maynard.net/ Document Released: 09/16/2000 Document Revised: 02/03/2014 Document Reviewed: 12/19/2011 [facility] Patient Information 2015 [place], [place]. This information is not intended to replace advice given to you by your health care provider. Make sure you discuss any questions you have with your health care provider.  Paroxetine tablets What is this medicine? PAROXETINE (pa ROX e  teen) is used to treat depression. It may also be used to treat anxiety disorders, obsessive compulsive disorder, panic attacks, post traumatic stress, and premenstrual dysphoric disorder (PMDD). This medicine may be used for other purposes; ask your health care provider or pharmacist if you have questions. COMMON BRAND NAME(S): Paxil, Pexeva What should I tell my health care provider before I take this medicine? They need to know if you have any of these conditions: -bleeding disorders -glaucoma -heart disease -kidney disease -liver disease -low levels of sodium in the blood -mania or bipolar disorder -seizures -suicidal thoughts, plans, or attempt; a previous suicide attempt by you or a family member -take MAOIs like Carbex, Eldepryl, Marplan, Nardil, and Parnate -take medicines that treat or prevent blood clots -an unusual or allergic reaction to paroxetine, other medicines, foods, dyes, or preservatives -pregnant or trying to get pregnant -breast-feeding How should I use this medicine? Take this medicine by mouth with a glass of water.  Follow the directions on the prescription label. You can take it with or without food. Take your medicine at regular intervals. Do not take your medicine more often than directed. Do not stop taking this medicine suddenly except upon the advice of your doctor. Stopping this medicine too quickly may cause serious side effects or your condition may worsen. A special MedGuide will be given to you by the pharmacist with each prescription and refill. Be sure to read this information carefully each time. Talk to your pediatrician regarding the use of this medicine in children. Special care may be needed. Overdosage: If you think you have taken too much of this medicine contact a poison control center or emergency room at once. NOTE: This medicine is only for you. Do not share this medicine with others. What if I miss a dose? If you miss a dose, take it as soon  as you can. If it is almost time for your next dose, take only that dose. Do not take double or extra doses. What may interact with this medicine? Do not take this medicine with any of the following medications: -linezolid -MAOIs like Carbex, Eldepryl, Marplan, Nardil, and Parnate -methylene blue (injected into a vein) -pimozide -thioridazine This medicine may also interact with the following medications: -alcohol -aspirin and aspirin-like medicines -atomoxetine -certain medicines for depression, anxiety, or psychotic disturbances -certain medicines for irregular heart beat like propafenone, flecainide, encainide, and quinidine -certain medicines for migraine headache like almotriptan, eletriptan, frovatriptan, naratriptan, rizatriptan, sumatriptan, zolmitriptan -cimetidine -digoxin -diuretics -fentanyl -fosamprenavir/ritonavir -furazolidone -isoniazid -lithium -medicines that treat or prevent blood clots like warfarin, enoxaparin, and dalteparin -medicines for sleep -metoprolol -NSAIDs, medicines for pain and inflammation, like ibuprofen or naproxen -phenobarbital -phenytoin -procarbazine -procyclidine -rasagiline -supplements like St. John's wort, kava kava, valerian -tamoxifen -theophylline -tramadol -tryptophan This list may not describe all possible interactions. Give your health care provider a list of all the medicines, herbs, non-prescription drugs, or dietary supplements you use. Also tell them if you smoke, drink alcohol, or use illegal drugs. Some items may interact with your medicine. What should I watch for while using this medicine? Tell your doctor if your symptoms do not get better or if they get worse. Visit your doctor or health care professional for regular checks on your progress. Because it may take several weeks to see the full effects of this medicine, it is important to continue your treatment as prescribed by your doctor. Patients and their families  should watch out for new or worsening thoughts of suicide or depression. Also watch out for sudden changes in feelings such as feeling anxious, agitated, panicky, irritable, hostile, aggressive, impulsive, severely restless, overly excited and hyperactive, or not being able to sleep. If this happens, especially at the beginning of treatment or after a change in dose, call your health care professional. Bonita Quin may get drowsy or dizzy. Do not drive, use machinery, or do anything that needs mental alertness until you know how this medicine affects you. Do not stand or sit up quickly, especially if you are an older patient. This reduces the risk of dizzy or fainting spells. Alcohol may interfere with the effect of this medicine. Avoid alcoholic drinks. Your mouth may get dry. Chewing sugarless gum or sucking hard candy, and drinking plenty of water will help. Contact your doctor if the problem does not go away or is severe. What side effects may I notice from receiving this medicine? Side effects that you should report to your doctor or health care  professional as soon as possible: -allergic reactions like skin rash, itching or hives, swelling of the face, lips, or tongue -black or bloody stools, blood in the urine or vomit -fast, irregular heartbeat -hallucination, loss of contact with reality -painful or prolonged erection (men) -seizures -suicidal thoughts or other mood changes -trouble passing urine or change in the amount of urine -unusual bleeding or bruising -unusually weak or tired -vomiting Side effects that usually do not require medical attention (report to your doctor or health care professional if they continue or are bothersome): -change in appetite, weight -change in sex drive or performance -constipation or diarrhea -difficulty sleeping -drowsy -headache -increased sweating -muscle pain or weakness -tremors This list may not describe all possible side effects. Call your doctor for  medical advice about side effects. You may report side effects to FDA at 1-800-FDA-1088. Where should I keep my medicine? Keep out of the reach of children. Store at room temperature between 15 and 30 degrees C (59 and 86 degrees F). Keep container tightly closed. Throw away any unused medicine after the expiration date. NOTE: This sheet is a summary. It may not cover all possible information. If you have questions about this medicine, talk to your doctor, pharmacist, or health care provider.  2015, Elsevier/Gold Standard. (2012-05-03 18:10:02)  Alprazolam tablets What is this medicine? ALPRAZOLAM (al PRAY zoe lam) is a benzodiazepine. It is used to treat anxiety and panic attacks. This medicine may be used for other purposes; ask your health care provider or pharmacist if you have questions. COMMON BRAND NAME(S): Xanax What should I tell my health care provider before I take this medicine? They need to know if you have any of these conditions: -an alcohol or drug abuse problem -bipolar disorder, depression, psychosis or other mental health conditions -glaucoma -kidney or liver disease -lung or breathing disease -myasthenia gravis -Parkinson's disease -porphyria -seizures or a history of seizures -suicidal thoughts -an unusual or allergic reaction to alprazolam, other benzodiazepines, foods, dyes, or preservatives -pregnant or trying to get pregnant -breast-feeding How should I use this medicine? Take this medicine by mouth with a glass of water. Follow the directions on the prescription label. Take your medicine at regular intervals. Do not take it more often than directed. If you have been taking this medicine regularly for some time, do not suddenly stop taking it. You must gradually reduce the dose or you may get severe side effects. Ask your doctor or health care professional for advice. Even after you stop taking this medicine it can still affect your body for several days. Talk  to your pediatrician regarding the use of this medicine in children. Special care may be needed. Overdosage: If you think you have taken too much of this medicine contact a poison control center or emergency room at once. NOTE: This medicine is only for you. Do not share this medicine with others. What if I miss a dose? If you miss a dose, take it as soon as you can. If it is almost time for your next dose, take only that dose. Do not take double or extra doses. What may interact with this medicine? Do not take this medicine with any of the following medications: -certain medicines for HIV infection or AIDS -ketoconazole -itraconazole This medicine may also interact with the following medications: -birth control pills -certain macrolide antibiotics like clarithromycin, erythromycin, troleandomycin -cimetidine -cyclosporine -ergotamine -grapefruit juice -herbal or dietary supplements like kava kava, melatonin, dehydroepiandrosterone, DHEA, St. John's Wort or valerian -imatinib, [id number] -  isoniazid -levodopa -medicines for depression, anxiety, or psychotic disturbances -prescription pain medicines -rifampin, rifapentine, or rifabutin -some medicines for blood pressure or heart problems -some medicines for seizures like carbamazepine, oxcarbazepine, phenobarbital, phenytoin, primidone This list may not describe all possible interactions. Give your health care provider a list of all the medicines, herbs, non-prescription drugs, or dietary supplements you use. Also tell them if you smoke, drink alcohol, or use illegal drugs. Some items may interact with your medicine. What should I watch for while using this medicine? Visit your doctor or health care professional for regular checks on your progress. Your body can become dependent on this medicine. Ask your doctor or health care professional if you still need to take it. You may get drowsy or dizzy. Do not drive, use machinery, or do anything  that needs mental alertness until you know how this medicine affects you. To reduce the risk of dizzy and fainting spells, do not stand or sit up quickly, especially if you are an older patient. Alcohol may increase dizziness and drowsiness. Avoid alcoholic drinks. Do not treat yourself for coughs, colds or allergies without asking your doctor or health care professional for advice. Some ingredients can increase possible side effects. What side effects may I notice from receiving this medicine? Side effects that you should report to your doctor or health care professional as soon as possible: -allergic reactions like skin rash, itching or hives, swelling of the face, lips, or tongue -confusion, forgetfulness -depression -difficulty sleeping -difficulty speaking -feeling faint or lightheaded, falls -mood changes, excitability or aggressive behavior -muscle cramps -trouble passing urine or change in the amount of urine -unusually weak or tired Side effects that usually do not require medical attention (report to your doctor or health care professional if they continue or are bothersome): -change in sex drive or performance -changes in appetite This list may not describe all possible side effects. Call your doctor for medical advice about side effects. You may report side effects to FDA at 1-800-FDA-1088. Where should I keep my medicine? Keep out of the reach of children. This medicine can be abused. Keep your medicine in a safe place to protect it from theft. Do not share this medicine with anyone. Selling or giving away this medicine is dangerous and against the law. Store at room temperature between 20 and 25 degrees C (68 and 77 degrees F). Throw away any unused medicine after the expiration date. NOTE: This sheet is a summary. It may not cover all possible information. If you have questions about this medicine, talk to your doctor, pharmacist, or health care provider.  2015, Elsevier/Gold  Standard. (2007-12-13 10:34:46)

## 2015-04-17 ENCOUNTER — Emergency Department (HOSPITAL_COMMUNITY): Payer: Self-pay

## 2015-04-17 ENCOUNTER — Encounter (HOSPITAL_COMMUNITY): Payer: Self-pay | Admitting: Physical Medicine and Rehabilitation

## 2015-04-17 ENCOUNTER — Emergency Department (HOSPITAL_COMMUNITY)
Admission: EM | Admit: 2015-04-17 | Discharge: 2015-04-18 | Disposition: A | Discharge status: Other Acute Inpatient Hospital | Payer: Self-pay | Attending: Emergency Medicine | Admitting: Emergency Medicine

## 2015-04-17 DIAGNOSIS — Z720 Tobacco use: Secondary | ICD-10-CM | POA: Insufficient documentation

## 2015-04-17 DIAGNOSIS — Z8742 Personal history of other diseases of the female genital tract: Secondary | ICD-10-CM | POA: Insufficient documentation

## 2015-04-17 DIAGNOSIS — Z3202 Encounter for pregnancy test, result negative: Secondary | ICD-10-CM | POA: Insufficient documentation

## 2015-04-17 DIAGNOSIS — J45909 Unspecified asthma, uncomplicated: Secondary | ICD-10-CM | POA: Insufficient documentation

## 2015-04-17 DIAGNOSIS — F329 Major depressive disorder, single episode, unspecified: Secondary | ICD-10-CM | POA: Insufficient documentation

## 2015-04-17 DIAGNOSIS — F1210 Cannabis abuse, uncomplicated: Secondary | ICD-10-CM | POA: Insufficient documentation

## 2015-04-17 DIAGNOSIS — F1310 Sedative, hypnotic or anxiolytic abuse, uncomplicated: Secondary | ICD-10-CM | POA: Insufficient documentation

## 2015-04-17 DIAGNOSIS — F32A Depression, unspecified: Secondary | ICD-10-CM

## 2015-04-17 LAB — CBC WITH DIFFERENTIAL/PLATELET
Basophils Absolute: 0 10*3/uL (ref 0.0–0.1)
Basophils Relative: 1 % (ref 0–1)
EOS PCT: 2 % (ref 0–5)
Eosinophils Absolute: 0.1 10*3/uL (ref 0.0–0.7)
HCT: 37.3 % (ref 36.0–46.0)
Hemoglobin: 12.3 g/dL (ref 12.0–15.0)
LYMPHS ABS: 3 10*3/uL (ref 0.7–4.0)
Lymphocytes Relative: 49 % — ABNORMAL HIGH (ref 12–46)
MCH: 26.8 pg (ref 26.0–34.0)
MCHC: 33 g/dL (ref 30.0–36.0)
MCV: 81.3 fL (ref 78.0–100.0)
Monocytes Absolute: 0.5 10*3/uL (ref 0.1–1.0)
Monocytes Relative: 8 % (ref 3–12)
Neutro Abs: 2.4 10*3/uL (ref 1.7–7.7)
Neutrophils Relative %: 40 % — ABNORMAL LOW (ref 43–77)
Platelets: 259 10*3/uL (ref 150–400)
RBC: 4.59 MIL/uL (ref 3.87–5.11)
RDW: 13 % (ref 11.5–15.5)
WBC: 6.1 10*3/uL (ref 4.0–10.5)

## 2015-04-17 LAB — ACETAMINOPHEN LEVEL: Acetaminophen (Tylenol), Serum: <10 ug/mL — ABNORMAL LOW (ref 10–30)

## 2015-04-17 LAB — POC URINE PREG, ED: Preg Test, Ur: NEGATIVE

## 2015-04-17 LAB — COMPREHENSIVE METABOLIC PANEL
ALT: 11 U/L — ABNORMAL LOW (ref 14–54)
ANION GAP: 7 (ref 5–15)
AST: 16 U/L (ref 15–41)
Albumin: 3.9 g/dL (ref 3.5–5.0)
Alkaline Phosphatase: 39 U/L (ref 38–126)
BUN: 6 mg/dL (ref 6–20)
CO2: 23 mmol/L (ref 22–32)
CREATININE: 0.71 mg/dL (ref 0.44–1.00)
Calcium: 9.1 mg/dL (ref 8.9–10.3)
Chloride: 112 mmol/L — ABNORMAL HIGH (ref 101–111)
GFR calc non Af Amer: >60 mL/min (ref >60)
Glucose, Bld: 88 mg/dL (ref 65–99)
Potassium: 3.5 mmol/L (ref 3.5–5.1)
Sodium: 142 mmol/L (ref 135–145)
TOTAL PROTEIN: 6.6 g/dL (ref 6.5–8.1)
Total Bilirubin: 1 mg/dL (ref 0.3–1.2)

## 2015-04-17 LAB — RAPID URINE DRUG SCREEN, HOSP PERFORMED
Amphetamines: NOT DETECTED
Barbiturates: NOT DETECTED
Benzodiazepines: POSITIVE — AB
Cocaine: NOT DETECTED
OPIATES: NOT DETECTED
Tetrahydrocannabinol: POSITIVE — AB

## 2015-04-17 LAB — ETHANOL: Alcohol, Ethyl (B): <5 mg/dL (ref <5)

## 2015-04-17 LAB — SALICYLATE LEVEL: Salicylate Lvl: <4 mg/dL (ref 2.8–30.0)

## 2015-04-17 MED ORDER — ACETAMINOPHEN 325 MG PO TABS
650.0000 mg | ORAL_TABLET | ORAL | Status: DC | PRN
  Administered 2015-04-17: 650 mg via ORAL
  Filled 2015-04-17 (×2): qty 2

## 2015-04-17 MED ORDER — ONDANSETRON 4 MG PO TBDP
8.0000 mg | ORAL_TABLET | Freq: Once | ORAL | Status: AC
  Administered 2015-04-17: 8 mg via ORAL
  Filled 2015-04-17: qty 2

## 2015-04-17 MED ORDER — NICOTINE 21 MG/24HR TD PT24
21.0000 mg | MEDICATED_PATCH | Freq: Once | TRANSDERMAL | Status: DC
  Administered 2015-04-17: 21 mg via TRANSDERMAL
  Filled 2015-04-17: qty 1

## 2015-04-17 MED ORDER — IBUPROFEN 400 MG PO TABS
600.0000 mg | ORAL_TABLET | Freq: Three times a day (TID) | ORAL | Status: DC | PRN
  Administered 2015-04-17: 600 mg via ORAL
  Filled 2015-04-17 (×2): qty 1

## 2015-04-17 MED ORDER — LORAZEPAM 1 MG PO TABS
1.0000 mg | ORAL_TABLET | Freq: Three times a day (TID) | ORAL | Status: DC | PRN
  Administered 2015-04-17: 1 mg via ORAL
  Filled 2015-04-17: qty 1

## 2015-04-17 NOTE — ED Notes (Addendum)
Pt ambulated to bathroom. Pt aware of [facility].

## 2015-04-17 NOTE — ED Notes (Signed)
Pt stating that she wants to leave. MD Lynelle DoctorKnapp aware.

## 2015-04-17 NOTE — ED Notes (Signed)
Sitter at bedside. Pt has been wanded and is in maroon scrubs. Pt's sister took all belongings home. (clothes, purse, cell phone)
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
## 2015-04-17 NOTE — ED Notes (Signed)
Pt began crying and was asked by Tech, "what is wrong?" Pt stated that she is "having withdrawals from everything". Pt stated that she wants to succeed with treatment. Tech suggested that pt  get up and walk around, but pt stated that her "legs were too weak and shaky". Pt was offered a wheelchair to get around. Pt accepted. Pt asked for Tylenol for pain. RN was informed that pt wanted Tylenol.

## 2015-04-17 NOTE — ED Notes (Signed)
Roselyn BeringJ Knapp MD at bedside.

## 2015-04-17 NOTE — Progress Notes (Signed)
[facility] to Dr. Jama Flavorsobos, bed 400-1, pt. can come after 23:00. Call report at [phone]. RN Emelie informed.  Caroline Gaines, LCSWA Disposition staff 04/17/2015 8:54 PM

## 2015-04-17 NOTE — BH Assessment (Signed)
[facility] Assessment Progress Note    Called and scheduled pt's tele assessment with this clinician as well as gathered clinical information on the pt from EDP Steinl.  Casimer LaniusKristen Alyse Kathan, MS, [facility] Therapeutic Triage Specialist [facility]

## 2015-04-17 NOTE — ED Notes (Signed)
Pt reporting increased anxiety and agitation. Pt cooperative but very agitated at this time.

## 2015-04-17 NOTE — ED Notes (Signed)
Gave pt Malawiturkey sandwich and apple juice, per Efraim KaufmannMelissa - RN

## 2015-04-17 NOTE — ED Notes (Addendum)
Pt experiencing anxiety.Pt reporting that she cannot catch her breath. Vital signs checked. Pt instructed to breathe slowly and concentrate on taking deep breaths. Pt sts "i just can't" Pt taught deep breathing techniques.

## 2015-04-17 NOTE — ED Notes (Signed)
[facility] contacted for transport. This RN was told that they should be here around 11:30.

## 2015-04-17 NOTE — ED Provider Notes (Signed)
CSN: [id number]     Arrival date & time 04/17/15  1307 History   First MD Initiated Contact with Patient 04/17/15 1400     Chief Complaint  Patient presents with  . Suicidal     (Consider location/radiation/quality/duration/timing/severity/associated sxs/prior Treatment) The history is provided by the patient.  Patient w hx anxiety and depression, c/o increased depression in the past couple weeks. Moderate, constant. States recent stressors w prior divorce, and 'everything'.   Denies current prescription med use. Denies etoh abuse. Normal appetite. Some trouble sleeping at night. No recent wt loss or gain. Denies other recent health problem or other symptoms. +SI. No definite plan or attempt.           Past Medical History  Diagnosis Date  . Asthma   . Endometriosis    History reviewed. No pertinent past surgical history. No family history on file. History  Substance Use Topics  . Smoking status: Current Every Day Smoker -- 0.50 packs/day    Types: Cigarettes  . Smokeless tobacco: Not on file  . Alcohol Use: Yes     Comment: social   OB History    No data available     Review of Systems  Constitutional: Negative for fever and chills.  Eyes: Negative for redness.  Respiratory: Negative for shortness of breath.   Cardiovascular: Negative for chest pain.  Gastrointestinal: Negative for abdominal pain.  Genitourinary: Negative for flank pain.  Musculoskeletal: Negative for back pain and neck pain.  Skin: Negative for rash.  Neurological: Negative for headaches.  Hematological: Does not bruise/bleed easily.  Psychiatric/Behavioral: Positive for suicidal ideas and dysphoric mood.      Allergies  Review of patient's allergies indicates no known allergies.  Home Medications   Prior to Admission medications   Medication Sig Start Date End Date Taking? Authorizing Provider  alprazolam Prudy Feeler) 2 MG tablet Take 1 tablet (2 mg total) by mouth See admin instructions.  Take one tablet four times a day for three days, then one tablet three times a day for three days, then one tablet twice a day for three days, then one tablet a day until all tablets have been taken 04/16/15   Dione Booze, MD  Ibuprofen-Diphenhydramine Cit (ADVIL PM) 200-38 MG TABS Take 2 capsules by mouth at bedtime as needed (for cold).    Historical Provider, MD  PARoxetine (PAXIL) 20 MG tablet Take 1 tablet (20 mg total) by mouth every morning. 04/16/15   Dione Booze, MD   BP 121/73 mmHg  Pulse 71  Temp(Src) 98 F (36.7 C) (Oral)  Resp 16  Ht  (1.651 m)  Wt 134 lb (60.782 kg)  BMI 22.30 kg/m2  SpO2 98%  LMP 04/10/2015 Physical Exam  Constitutional: She is oriented to person, place, and time. She appears well-developed and well-nourished. No distress.  HENT:  Head: Atraumatic.  Mouth/Throat: Oropharynx is clear and moist.  Eyes: Conjunctivae are normal. Pupils are equal, round, and reactive to light. No scleral icterus.  Neck: Neck supple. No tracheal deviation present.  Cardiovascular: Normal rate.   Pulmonary/Chest: Effort normal. No respiratory distress.  Abdominal: Normal appearance. She exhibits no distension.  Musculoskeletal: She exhibits no edema.  Neurological: She is alert and oriented to person, place, and time.  Steady gait. No tremor or shakes.   Skin: Skin is warm and dry. No rash noted.  Psychiatric:  Tearful, depressed mood.  +SI.   Nursing note and vitals reviewed.   ED Course  Procedures (including critical  care time) Labs Review  Results for orders placed or performed during the hospital encounter of 04/17/15  CBC with Differential  Result Value Ref Range   WBC 6.1 4.0 - 10.5 K/uL   RBC 4.59 3.87 - 5.11 MIL/uL   Hemoglobin 12.3 12.0 - 15.0 g/dL   HCT 45.437.3 09.836.0 - 11.946.0 %   MCV 81.3 78.0 - 100.0 fL   MCH 26.8 26.0 - 34.0 pg   MCHC 33.0 30.0 - 36.0 g/dL   RDW 14.713.0 82.911.5 - 56.215.5 %   Platelets 259 150 - 400 K/uL   Neutrophils Relative % 40 (L) 43 - 77  %   Neutro Abs 2.4 1.7 - 7.7 K/uL   Lymphocytes Relative 49 (H) 12 - 46 %   Lymphs Abs 3.0 0.7 - 4.0 K/uL   Monocytes Relative 8 3 - 12 %   Monocytes Absolute 0.5 0.1 - 1.0 K/uL   Eosinophils Relative 2 0 - 5 %   Eosinophils Absolute 0.1 0.0 - 0.7 K/uL   Basophils Relative 1 0 - 1 %   Basophils Absolute 0.0 0.0 - 0.1 K/uL  Comprehensive metabolic panel  Result Value Ref Range   Sodium 142 135 - 145 mmol/L   Potassium 3.5 3.5 - 5.1 mmol/L   Chloride 112 (H) 101 - 111 mmol/L   CO2 23 22 - 32 mmol/L   Glucose, Bld 88 65 - 99 mg/dL   BUN 6 6 - 20 mg/dL   Creatinine, Ser 1.300.71 0.44 - 1.00 mg/dL   Calcium 9.1 8.9 - 86.510.3 mg/dL   Total Protein 6.6 6.5 - 8.1 g/dL   Albumin 3.9 3.5 - 5.0 g/dL   AST 16 15 - 41 U/L   ALT 11 (L) 14 - 54 U/L   Alkaline Phosphatase 39 38 - 126 U/L   Total Bilirubin 1.0 0.3 - 1.2 mg/dL   GFR calc non Af Amer >60 >60 mL/min   GFR calc Af Amer >60 >60 mL/min   Anion gap 7 5 - 15  Ethanol  Result Value Ref Range   Alcohol, Ethyl (B) <5 <5 mg/dL  Salicylate level  Result Value Ref Range   Salicylate Lvl <4.0 2.8 - 30.0 mg/dL  Acetaminophen level  Result Value Ref Range   Acetaminophen (Tylenol), Serum <10 (L) 10 - 30 ug/mL  POC Urine Pregnancy, ED ([facility])  Result Value Ref Range   Preg Test, Ur NEGATIVE NEGATIVE       MDM   Labs.  Psych team consulted.  Reviewed nursing notes and prior charts for additional history.   Recheck, alert, content, no distress.  Awaiting psych team eval.  Disposition per psych team.  Will move to pod C.  Psych holding orders placed.       Cathren LaineKevin Rayen Dafoe, MD 04/17/15 [phone]

## 2015-04-17 NOTE — ED Notes (Signed)
Pt changed into paper scrubs, security called to wand patient, staffing notified.

## 2015-04-17 NOTE — ED Notes (Signed)
Report given to RN in pod C. Pt will go to C22

## 2015-04-17 NOTE — ED Notes (Signed)
Pt tearful stating "My whole body hurts." Pt denies ETOH, drugs today.

## 2015-04-17 NOTE — ED Notes (Signed)
[facility]

## 2015-04-17 NOTE — ED Provider Notes (Signed)
Asked to speak with patient she was considering leaving.  Explained to patient that she was seen by [facility].  They recommended admission.  We are waiting for an available bed.  Pt agrees to stay.  She also asked about her lower back and knees.  No brusing noted on her back.  Small bruises noted around both knees.  No effusions.  No deformity.    Will xray right knee as requested.  Doubt significant injury  Linwood DibblesJon Carlis Burnsworth, MD 04/17/15 1715

## 2015-04-17 NOTE — ED Notes (Signed)
Pt presents to department for evaluation of suicidal ideation. Pt reports she has issues with anxiety and depression. Reports she wants to die, but does not have specific plan. Pt is calm and cooperative at the time. Pt is alert and oriented x4.

## 2015-04-18 ENCOUNTER — Encounter (HOSPITAL_COMMUNITY): Payer: Self-pay | Admitting: Nurse Practitioner

## 2015-04-18 ENCOUNTER — Inpatient Hospital Stay (HOSPITAL_COMMUNITY)
Admission: AD | Admit: 2015-04-18 | Discharge: 2015-04-22 | DRG: 885 | Disposition: A | Discharge status: Home / Self Care | Payer: Federal, State, Local not specified - Other | Source: Intra-hospital | Attending: Psychiatry | Admitting: Psychiatry

## 2015-04-18 DIAGNOSIS — F329 Major depressive disorder, single episode, unspecified: Secondary | ICD-10-CM | POA: Diagnosis present

## 2015-04-18 DIAGNOSIS — F17210 Nicotine dependence, cigarettes, uncomplicated: Secondary | ICD-10-CM | POA: Diagnosis present

## 2015-04-18 DIAGNOSIS — F332 Major depressive disorder, recurrent severe without psychotic features: Principal | ICD-10-CM | POA: Diagnosis present

## 2015-04-18 DIAGNOSIS — F411 Generalized anxiety disorder: Secondary | ICD-10-CM

## 2015-04-18 DIAGNOSIS — F1910 Other psychoactive substance abuse, uncomplicated: Secondary | ICD-10-CM | POA: Diagnosis present

## 2015-04-18 HISTORY — DX: Major depressive disorder, single episode, unspecified: F32.9

## 2015-04-18 HISTORY — DX: Anxiety disorder, unspecified: F41.9

## 2015-04-18 HISTORY — DX: Unspecified convulsions: R56.9

## 2015-04-18 HISTORY — DX: Depression, unspecified: F32.A

## 2015-04-18 MED ORDER — ALPRAZOLAM 1 MG PO TABS: 2.0000 mg | ORAL_TABLET | ORAL | Status: DC

## 2015-04-18 MED ORDER — ADULT MULTIVITAMIN W/MINERALS CH
1.0000 | ORAL_TABLET | Freq: Every day | ORAL | Status: DC
  Administered 2015-04-18 – 2015-04-22 (×5): 1 via ORAL
  Filled 2015-04-18 (×7): qty 1

## 2015-04-18 MED ORDER — CHLORDIAZEPOXIDE HCL 25 MG PO CAPS
25.0000 mg | ORAL_CAPSULE | Freq: Four times a day (QID) | ORAL | Status: AC
  Administered 2015-04-18 – 2015-04-19 (×4): 25 mg via ORAL
  Filled 2015-04-18 (×4): qty 1

## 2015-04-18 MED ORDER — NICOTINE 21 MG/24HR TD PT24
21.0000 mg | MEDICATED_PATCH | Freq: Every day | TRANSDERMAL | Status: DC
  Administered 2015-04-18 – 2015-04-22 (×5): 21 mg via TRANSDERMAL
  Filled 2015-04-18 (×5): qty 1
  Filled 2015-04-18 (×2): qty 14
  Filled 2015-04-18: qty 1

## 2015-04-18 MED ORDER — VITAMIN B-1 100 MG PO TABS
100.0000 mg | ORAL_TABLET | Freq: Every day | ORAL | Status: DC
  Administered 2015-04-19 – 2015-04-22 (×4): 100 mg via ORAL
  Filled 2015-04-18 (×6): qty 1

## 2015-04-18 MED ORDER — ALUM & MAG HYDROXIDE-SIMETH 200-200-20 MG/5ML PO SUSP: 30.0000 mL | ORAL | Status: DC | PRN

## 2015-04-18 MED ORDER — BUPROPION HCL ER (XL) 150 MG PO TB24: 150.0000 mg | ORAL_TABLET | Freq: Every day | ORAL | Status: DC

## 2015-04-18 MED ORDER — MIRTAZAPINE 7.5 MG PO TABS
7.5000 mg | ORAL_TABLET | Freq: Every day | ORAL | Status: DC
  Administered 2015-04-18 – 2015-04-19 (×2): 7.5 mg via ORAL
  Filled 2015-04-18 (×4): qty 1

## 2015-04-18 MED ORDER — MAGNESIUM HYDROXIDE 400 MG/5ML PO SUSP
30.0000 mL | Freq: Every day | ORAL | Status: DC | PRN
  Administered 2015-04-18 – 2015-04-19 (×2): 30 mL via ORAL
  Filled 2015-04-18 (×2): qty 30

## 2015-04-18 MED ORDER — HYDROXYZINE HCL 50 MG PO TABS
50.0000 mg | ORAL_TABLET | Freq: Three times a day (TID) | ORAL | Status: DC | PRN
  Administered 2015-04-18: 50 mg via ORAL
  Filled 2015-04-18: qty 1

## 2015-04-18 MED ORDER — HYDROXYZINE HCL 25 MG PO TABS
25.0000 mg | ORAL_TABLET | Freq: Four times a day (QID) | ORAL | Status: AC | PRN
  Administered 2015-04-18 – 2015-04-20 (×7): 25 mg via ORAL
  Filled 2015-04-18 (×7): qty 1

## 2015-04-18 MED ORDER — CHLORDIAZEPOXIDE HCL 25 MG PO CAPS
25.0000 mg | ORAL_CAPSULE | Freq: Four times a day (QID) | ORAL | Status: AC | PRN
  Administered 2015-04-19 – 2015-04-20 (×2): 25 mg via ORAL
  Filled 2015-04-18 (×2): qty 1

## 2015-04-18 MED ORDER — CHLORDIAZEPOXIDE HCL 25 MG PO CAPS
25.0000 mg | ORAL_CAPSULE | Freq: Once | ORAL | Status: AC
  Administered 2015-04-18: 25 mg via ORAL
  Filled 2015-04-18: qty 1

## 2015-04-18 MED ORDER — ACETAMINOPHEN 325 MG PO TABS
650.0000 mg | ORAL_TABLET | Freq: Four times a day (QID) | ORAL | Status: DC | PRN
  Administered 2015-04-18 – 2015-04-22 (×7): 650 mg via ORAL
  Filled 2015-04-18 (×7): qty 2

## 2015-04-18 MED ORDER — CHLORDIAZEPOXIDE HCL 25 MG PO CAPS
25.0000 mg | ORAL_CAPSULE | ORAL | Status: AC
  Administered 2015-04-20 – 2015-04-21 (×2): 25 mg via ORAL
  Filled 2015-04-18 (×2): qty 1

## 2015-04-18 MED ORDER — TRAZODONE HCL 100 MG PO TABS: 100.0000 mg | ORAL_TABLET | Freq: Every evening | ORAL | Status: DC | PRN

## 2015-04-18 MED ORDER — PAROXETINE HCL 20 MG PO TABS
20.0000 mg | ORAL_TABLET | ORAL | Status: DC
  Administered 2015-04-19 – 2015-04-22 (×4): 20 mg via ORAL
  Filled 2015-04-18 (×5): qty 1
  Filled 2015-04-18: qty 14
  Filled 2015-04-18: qty 1
  Filled 2015-04-18: qty 14
  Filled 2015-04-18: qty 1

## 2015-04-18 MED ORDER — ONDANSETRON 4 MG PO TBDP: 4.0000 mg | ORAL_TABLET | Freq: Four times a day (QID) | ORAL | Status: AC | PRN

## 2015-04-18 MED ORDER — CHLORDIAZEPOXIDE HCL 25 MG PO CAPS
25.0000 mg | ORAL_CAPSULE | Freq: Three times a day (TID) | ORAL | Status: AC
  Administered 2015-04-19 – 2015-04-20 (×3): 25 mg via ORAL
  Filled 2015-04-18 (×3): qty 1

## 2015-04-18 MED ORDER — LORAZEPAM 1 MG PO TABS
1.0000 mg | ORAL_TABLET | Freq: Three times a day (TID) | ORAL | Status: DC | PRN
  Administered 2015-04-18: 1 mg via ORAL
  Filled 2015-04-18: qty 1

## 2015-04-18 MED ORDER — ENSURE ENLIVE PO LIQD
237.0000 mL | Freq: Two times a day (BID) | ORAL | Status: DC
  Administered 2015-04-18 – 2015-04-21 (×6): 237 mL via ORAL

## 2015-04-18 MED ORDER — THIAMINE HCL 100 MG/ML IJ SOLN
100.0000 mg | Freq: Once | INTRAMUSCULAR | Status: AC
  Administered 2015-04-18: 100 mg via INTRAMUSCULAR
  Filled 2015-04-18: qty 2

## 2015-04-18 MED ORDER — LOPERAMIDE HCL 2 MG PO CAPS: 2.0000 mg | ORAL_CAPSULE | ORAL | Status: AC | PRN

## 2015-04-18 MED ORDER — CHLORDIAZEPOXIDE HCL 25 MG PO CAPS
25.0000 mg | ORAL_CAPSULE | Freq: Every day | ORAL | Status: AC
  Administered 2015-04-22: 25 mg via ORAL
  Filled 2015-04-18: qty 1
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
## 2015-04-18 NOTE — ED Notes (Signed)
Caroline Gaines has arrived to pick up [facility]

## 2015-04-18 NOTE — Progress Notes (Addendum)
D: Patient in the hallway on approach.  Patient states her anxiety is high and she states she is having withdrawal symptoms.  Patient states her brother and sister visited today and states she states that mad her more anxious.  Patient states it was a good visit.  Patient denies SI/HI and denies AVH. A: Staff to monitor Q 15 mins for safety.  Encouragement and support offered.  Scheduled medications administered per orders.  Tylenol administered prn for body aches.  Vistaril administered prn for anxiety. R: Patient remains safe on the unit.  Patient attended group tonight.  Patient visible on the unit and interacting with peers.  Patient taking administered medications.

## 2015-04-18 NOTE — ED Notes (Signed)
[facility]

## 2015-04-18 NOTE — Progress Notes (Signed)
Psychoeducational Group Note  Date: 04/18/2015 Time: 1015  Group Topic/Focus:  Identifying Needs:   The focus of this group is to help patients identify their personal needs that have been historically problematic and identify healthy behaviors to address their needs.  Participation Level:  Active  Participation Quality:  Appropriate  Affect:  Appropriate  Cognitive:  Appropriate  Insight:  Engaged  Engagement in Group:  Engaged  Additional Comments:    7/16/20162:37 PM Miyeko Mahlum, Joie BimlerPatricia Lynn

## 2015-04-18 NOTE — Progress Notes (Addendum)
D:  Patient's self inventory sheet, patient sleeps fair, sleep medication is helpful.  Fair appetite, low energy level, poor concentration.  Rated depression 9, hopeless 5, anxiety 10.  Withdrawals, diarrhea, chilling, cravings, cramping, agitation, irritability.  Denied SI.  Then stated she does have SI occasionally, contracts for safety.  Has experienced lightheadedness, pain, dizziness, headaches.  Physical pain, worst pain #10 in past 124 hours.  Has taken pain medication.  Goal is to call family, get clothes, discuss issue.  Plan to call, see MD, group meetings.  No discharge plans. A:  Medications administered per MD orders.  Emotional support and encouragement given patient. R:  Denied SI and HI while talking to nurse, contracts for safety.  Denied A/V hallucinations.  Safety maintained with 15 minute checks.  Patient was allowed to see her sister a few minutes when sister brought clothes to her at noon per NP approval.  Patient stated she would feel better if she could see her sister.
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
## 2015-04-18 NOTE — Tx Team (Signed)
Initial Interdisciplinary Treatment Plan   PATIENT STRESSORS: Educational concerns Financial difficulties Marital or family conflict Medication change or noncompliance Occupational concerns Substance abuse   PATIENT STRENGTHS: Average or above average intelligence Communication skills General fund of knowledge Religious Affiliation Supportive family/friends   PROBLEM LIST: Problem List/Patient Goals Date to be addressed Date deferred Reason deferred Estimated date of resolution  "Stop smoking" 04/18/2015     "Get clean" 04/18/2015     Depression 04/18/2015     Anxiety 04/18/2015     Suicide Risk  04/18/2015                              DISCHARGE CRITERIA:  Ability to meet basic life and health needs Adequate post-discharge living arrangements Improved stabilization in mood, thinking, and/or behavior Verbal commitment to aftercare and medication compliance Withdrawal symptoms are absent or subacute and managed without 24-hour nursing intervention  PRELIMINARY DISCHARGE PLAN: Attend aftercare/continuing care group Outpatient therapy Return to previous living arrangement  PATIENT/FAMIILY INVOLVEMENT: This treatment plan has been presented to and reviewed with the patient, Caroline Gaines.  The patient and family have been given the opportunity to ask questions and make suggestions.  Claiborne RiggZelda W Analisa Sledd 04/18/2015, 2:34 AM
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
## 2015-04-18 NOTE — [facility] Group Notes (Signed)
[facility] Group Notes:  (Nursing/MHT/Case Management/Adjunct)  Date:  04/18/2015  Time:  10:07 AM  Type of Therapy:  Goal Setting: The group focuses on educating patients how to set healthy goals as well as identify tools needed to accomplish these goals.  Participation Level:  Active  Participation Quality:  Appropriate  Affect:  Anxious  Cognitive:  Appropriate  Insight:    Engagement in Group:  Engaged  Modes of Intervention:  Education  Summary of Progress/Problems:  Rich BraveDuke, Cady Hafen Lynn 04/18/2015, 10:07 AM

## 2015-04-18 NOTE — Progress Notes (Signed)
Adult Psychoeducational Group Note  Date:  04/18/2015 Time:  9:04 PM  Group Topic/Focus:  Wrap-Up Group:   The focus of this group is to help patients review their daily goal of treatment and discuss progress on daily workbooks.  Participation Level:  Did Not Attend  Additional Comments:  Pt was invited to attend group, however stayed in her room.  Caswell CorwinOwen, Stacy Deshler C 04/18/2015, 9:04 PM

## 2015-04-19 MED ORDER — METHOCARBAMOL 500 MG PO TABS
500.0000 mg | ORAL_TABLET | Freq: Three times a day (TID) | ORAL | Status: AC
  Administered 2015-04-19 – 2015-04-20 (×3): 500 mg via ORAL
  Filled 2015-04-19 (×4): qty 1

## 2015-04-19 NOTE — Discharge Instructions (Signed)

## 2015-04-19 NOTE — [facility] Group Notes (Signed)
[facility] LCSW Group Therapy  04/19/2015  1:15 PM  Type of Therapy:  Group Therapy  Participation Level:  Active  Participation Quality:  Attentive and Sharing  Affect:  Appropriate  Cognitive:  Appropriate  Insight:  Developing/Improving  Engagement in Therapy:  Developing/Improving; patient required some redirect to refrain from side conversations  Modes of Intervention:  Discussion, Exploration, Problem-solving, Socialization and Support  Summary of Progress/Problems:  Summary of Progress/Problems: The main focus of today's process group was to identify the patient's current support system and decide on other supports that can be put in place. An emphasis was placed on using counselor, doctor, therapy groups, 12-step groups, and problem-specific support groups to expand supports. There was also an extensive discussion about possible fears related to success. Patient shared that she is not used to putting her own well being first and has 'used narcotics to self medicate for so long I don't know how I'll do when discharged.' Patient described wanting examples of sober/clean women in her life and others suggested recovery groups such as AA and NA. Patient identified need for counselor and a 12 step program. Patient reports she doesn't feel ready to take that on as yet "but hopefully will by discharge."  [name], [name]

## 2015-04-19 NOTE — BHH Counselor (Signed)
Unable to begin PSA at this time as patient needing assistance from RN staff to to pain in legs recently reported to RN, now stating her legs feel numb. Will attempt PSA later in the day. Caroline Bernatherine C [name], LCSW

## 2015-04-19 NOTE — Plan of Care (Signed)
Problem: Ineffective individual coping Goal: STG: Patient will remain free from self harm Outcome: Progressing Patient remains free from self harm. 15 minute checks continued per protocol for patient safety.   Problem: Alteration in mood & ability to function due to Goal: LTG-Pt reports reduction in suicidal thoughts (Patient reports reduction in suicidal thoughts and is able to verbalize a safety plan for whenever patient is feeling suicidal)  Outcome: Progressing Patient denies having suicidal thoughts today.  Problem: Alteration in mood; excessive anxiety as evidenced by: Goal: LTG-Patient's behavior demonstrates decreased anxiety (Patient's behavior demonstrates anxiety and he/she is utilizing learned coping skills to deal with anxiety-producing situations)  Outcome: Not Progressing Patient continues to experience heavy anxiety at times and requests PRN medication.

## 2015-04-19 NOTE — Progress Notes (Signed)
D: Patient is alert and oriented. Pt's mood and affect is depressed, sad, sullen, and anxious at times. Pt is tearful at times. Pt denies SI/HI and AVH. Pt rates depression 5/10, hopelessness 8/10, and anxiety 7/10. Pt reports her goal for the day is "learn, get through the day." Pt C/O anxiety this morning, severe bilateral leg weakness, spasms, numbness" and pain 9/10, with difficulty ambulating. Pain and anxiety both decreased with PRN medications and pt is later observed ambulating with ease post PRN and new scheduled medication administration. Pt reports inability to urinate this morning, which she was later able to urinate with ease. Pt hypotensive and bradycardic today, pt denies symptoms (See docflowsheet-vitals). Pt C/O constipation today stating her last bowel movements was "a few days ago", pt reports bowel movement this afternoon post PRN medication. Pt is attending some unit groups today. A: Active listening by RN. Encouragement/Support provided to pt. NP Nicole KindredAgnes made aware of pt's complaints/concerns. Pushing PO fluids when hypotensive, will reassess/monitor BP and pulse. Fall precautions/prevention education reviewed with pt. Medication education reviewed with pt. PRN medication administered for pain, anxiety, and constipation per providers orders (See MAR). Scheduled medications administered per providers orders (See MAR). 15 minute checks continued per protocol for patient safety.  R: Patient cooperative and receptive to nursing interventions. Pt remains safe.

## 2015-04-19 NOTE — Progress Notes (Signed)
D: Patient in room folding her clothes. She states she had a good day. Both her parents came to visit her. "I was shocked that my dad came to see me, but I was glad he did." She rates anxiety 6/10. Says she is going to continue to fold her clothes, get up, move around and do other things to distract herself for now. Asking about prn med she received last night that helped her sleep. Requesting the same med tonight. Also asking about her antidepressant and how long it will take to work for her. She denies SI/HI.  A: Praised patient for implementing ways to distract herself and deal with her anxiety in a positive, constructive way. Educated patient on her antidepressant and general information regarding how they usually work and the timeframe for effectiveness. Encouraged her to speak with her doctor in regards to her medications and questions she may have. She verbalized understanding.  R: Will continue to monitor and assess patient for safety.

## 2015-04-19 NOTE — [facility] Group Notes (Signed)
[facility] Group Notes:  (Nursing/MHT/Case Management/Adjunct)  Date:  04/19/2015  Time:  1030am  Type of Therapy:  Life skills, RN group  Participation Level:  Did Not Attend  Participation Quality:  Did not attend  Affect:  Did not attend  Cognitive:  Did not attend  Insight:  None  Engagement in Group:  Did not attend  Modes of Intervention:  Discussion, Education and Support  Summary of Progress/Problems: Patient was invited to group. Pt did not attend and remained in bed resting.  Lendell CapriceGuthrie, Vince Ainsley A 04/19/2015, 2:11 PM

## 2015-04-19 NOTE — Progress Notes (Signed)
D: Patient with multiple complaints of anxiety, symptoms of withdrawal (CIWA 11) and now with RLE pain (knee). She has tried using a heating pad and is now requesting tylenol as well as another medication to help her with her anxiety. Pain 10/10. However she does not appear to be experiencing a pain of 10/10. A: Encouraged patient to try distracting techniques such as reading her book in her room away from the busy milieu. Informed patient that she has already taken her prn medications for anxiety and pain tonight and she will not be able to receive for at least another 6 hours. PRN Librium given for CIWA 11 R: Continue to assess for medication effectiveness and encourage patient to utilize self coping strategies outside of medication.
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
## 2015-04-19 NOTE — Progress Notes (Signed)
[facility] MD Progress Note  04/19/2015 1:37 PM Caroline Gaines  MRN:  [id number]  Subjective: Caroline Gaines reports, "I have headaches, too much thinking. Trying to figure out things. I have cramps on my legs & knees. I'm not shaking any more today"  Objective: Caroline Gaines was seen, chart reviewed. She says today that she is not feeling too well. She complained of leg cramps, dizziness, headache pains. She has been staying in her room most of the shift. She did not participate in group sessions today. Caroline Gaines says appetite is is improving & she is sleeping well. She denies any SIHI, AVH, delusions or paranoia. She is started on Robaxin 500 mg tid x 3 days for muscle pains.  Principal Problem: Major depressive disorder, recurrent episode, severe  Diagnosis:   Patient Active Problem List   Diagnosis Date Noted  . Major depressive disorder, recurrent episode, severe [F33.2] 04/18/2015  . Generalized anxiety disorder [F41.1] 04/18/2015  . Polysubstance abuse [F19.10] 04/18/2015  . SMOKER [Z72.0] 11/02/2009  . ASTHMA [J45.909] 11/02/2009  . HEADACHE, CHRONIC [R51] 11/02/2009   Total Time spent with patient: 35 minutes  Past Medical History:  Past Medical History  Diagnosis Date  . Asthma   . Endometriosis   . Seizures   . Depression   . Anxiety     Past Surgical History  Procedure Laterality Date  . Endometrial ablation N/A 2012   Family History:  Family History  Problem Relation Age of Onset  . Asthma Father   . Hypertension Father   . Diabetes Mellitus II Father    Social History:  History  Alcohol Use  . Yes    Comment: social drinker only     History  Drug Use  . Yes  . Special: Cocaine, Marijuana, Oxycodone, Benzodiazepines, Heroin, Hydrocodone    Comment: daily use    History   Social History  . Marital Status: Single    Spouse Name: N/A  . Number of Children: N/A  . Years of Education: N/A   Social History Main Topics  . Smoking status: Current Every Day Smoker -- 1.50 packs/day  for 9 years    Types: Cigarettes  . Smokeless tobacco: Not on file  . Alcohol Use: Yes     Comment: social drinker only  . Drug Use: Yes    Special: Cocaine, Marijuana, Oxycodone, Benzodiazepines, Heroin, Hydrocodone     Comment: daily use  . Sexual Activity: Yes    Birth Control/ Protection: Condom     Comment: heroin   Other Topics Concern  . None   Social History Narrative   Additional History:    Sleep: Fair  Appetite:  Improving  Musculoskeletal: Strength & Muscle Tone: within normal limits Gait & Station: Weak Patient leans: N/A   Psychiatric Specialty Exam: Physical Exam  Review of Systems  Constitutional: Positive for malaise/fatigue and diaphoresis.  Eyes: Negative.   Respiratory: Negative.   Cardiovascular: Negative.   Gastrointestinal: Positive for nausea.  Genitourinary: Negative.   Musculoskeletal: Positive for myalgias and joint pain.  Skin: Negative.   Neurological: Positive for dizziness, weakness and headaches.  Endo/Heme/Allergies: Negative.   Psychiatric/Behavioral: Positive for depression and substance abuse (Polysubstance dependence). Negative for suicidal ideas, hallucinations and memory loss. The patient is nervous/anxious and has insomnia.     Blood pressure 109/62, pulse 60, temperature 97.5 F (36.4 C), temperature source Oral, resp. rate 16, height _0  (1.651 m), weight 60.328 kg (133 lb), last menstrual period 04/10/2015, SpO2 100 %.Body mass index is 22.13  kg/(m^2).  General Appearance: Casual and weak  Eye Contact::  Fair  Speech:  Slow and soft  Volume:  Decreased  Mood:  Anxious, Depressed and worried  Affect:  Congruent, Depressed and Flat  Thought Process:  Coherent and Intact  Orientation:  Full (Time, Place, and Person)  Thought Content:  Rumination  Suicidal Thoughts:  No  Homicidal Thoughts:  No  Memory:  Grossly intact  Judgement:  Fair  Insight:  Present  Psychomotor Activity:  Decreased  Concentration:  Fair   Recall:  Good  Fund of Knowledge:Fair  Language: Good  Akathisia:  No  Handed:  Right  AIMS (if indicated):     Assets:  Desire for Improvement  ADL's:  Intact  Cognition: WNL  Sleep:  Number of Hours: 5.25   Current Medications: Current Facility-Administered Medications  Medication Dose Route Frequency Provider Last Rate Last Dose  . acetaminophen (TYLENOL) tablet 650 mg  650 mg Oral Q6H PRN Harriet Butte, NP   650 mg at 04/19/15 0831  . alum & mag hydroxide-simeth (MAALOX/MYLANTA) 200-200-20 MG/5ML suspension 30 mL  30 mL Oral Q4H PRN Harriet Butte, NP      . chlordiazePOXIDE (LIBRIUM) capsule 25 mg  25 mg Oral Q6H PRN Encarnacion Slates, NP      . chlordiazePOXIDE (LIBRIUM) capsule 25 mg  25 mg Oral TID Encarnacion Slates, NP   25 mg at 04/19/15 1147   Followed by  . [START ON 04/20/2015] chlordiazePOXIDE (LIBRIUM) capsule 25 mg  25 mg Oral BH-qamhs Encarnacion Slates, NP       Followed by  . [START ON 04/22/2015] chlordiazePOXIDE (LIBRIUM) capsule 25 mg  25 mg Oral Daily Encarnacion Slates, NP      . feeding supplement (ENSURE ENLIVE) (ENSURE ENLIVE) liquid 237 mL  237 mL Oral BID BM Myer Peer Cobos, MD   237 mL at 04/19/15 0754  . hydrOXYzine (ATARAX/VISTARIL) tablet 25 mg  25 mg Oral Q6H PRN Encarnacion Slates, NP   25 mg at 04/19/15 0946  . loperamide (IMODIUM) capsule 2-4 mg  2-4 mg Oral PRN Encarnacion Slates, NP      . magnesium hydroxide (MILK OF MAGNESIA) suspension 30 mL  30 mL Oral Daily PRN Harriet Butte, NP   30 mL at 04/18/15 1639  . methocarbamol (ROBAXIN) tablet 500 mg  500 mg Oral TID Encarnacion Slates, NP   500 mg at 04/19/15 0946  . mirtazapine (REMERON) tablet 7.5 mg  7.5 mg Oral QHS Encarnacion Slates, NP   7.5 mg at 04/18/15 2204  . multivitamin with minerals tablet 1 tablet  1 tablet Oral Daily Encarnacion Slates, NP   1 tablet at 04/19/15 0754  . nicotine (NICODERM CQ - dosed in mg/24 hours) patch 21 mg  21 mg Transdermal Daily Jenne Campus, MD   21 mg at 04/19/15 0757  . ondansetron  (ZOFRAN-ODT) disintegrating tablet 4 mg  4 mg Oral Q6H PRN Encarnacion Slates, NP      . PARoxetine (PAXIL) tablet 20 mg  20 mg Oral BH-q7a Harriet Butte, NP   20 mg at 04/19/15 3419  . thiamine (VITAMIN B-1) tablet 100 mg  100 mg Oral Daily Encarnacion Slates, NP   100 mg at 04/19/15 6222    Lab Results:  Results for orders placed or performed during the hospital encounter of 04/17/15 (from the past 48 hour(s))  CBC with Differential     Status:  Abnormal   Collection Time: 04/17/15  2:05 PM  Result Value Ref Range   WBC 6.1 4.0 - 10.5 K/uL   RBC 4.59 3.87 - 5.11 MIL/uL   Hemoglobin 12.3 12.0 - 15.0 g/dL   HCT 37.3 36.0 - 46.0 %   MCV 81.3 78.0 - 100.0 fL   MCH 26.8 26.0 - 34.0 pg   MCHC 33.0 30.0 - 36.0 g/dL   RDW 13.0 11.5 - 15.5 %   Platelets 259 150 - 400 K/uL   Neutrophils Relative % 40 (L) 43 - 77 %   Neutro Abs 2.4 1.7 - 7.7 K/uL   Lymphocytes Relative 49 (H) 12 - 46 %   Lymphs Abs 3.0 0.7 - 4.0 K/uL   Monocytes Relative 8 3 - 12 %   Monocytes Absolute 0.5 0.1 - 1.0 K/uL   Eosinophils Relative 2 0 - 5 %   Eosinophils Absolute 0.1 0.0 - 0.7 K/uL   Basophils Relative 1 0 - 1 %   Basophils Absolute 0.0 0.0 - 0.1 K/uL  Comprehensive metabolic panel     Status: Abnormal   Collection Time: 04/17/15  2:05 PM  Result Value Ref Range   Sodium 142 135 - 145 mmol/L   Potassium 3.5 3.5 - 5.1 mmol/L   Chloride 112 (H) 101 - 111 mmol/L   CO2 23 22 - 32 mmol/L   Glucose, Bld 88 65 - 99 mg/dL   BUN 6 6 - 20 mg/dL   Creatinine, Ser 0.71 0.44 - 1.00 mg/dL   Calcium 9.1 8.9 - 10.3 mg/dL   Total Protein 6.6 6.5 - 8.1 g/dL   Albumin 3.9 3.5 - 5.0 g/dL   AST 16 15 - 41 U/L   ALT 11 (L) 14 - 54 U/L   Alkaline Phosphatase 39 38 - 126 U/L   Total Bilirubin 1.0 0.3 - 1.2 mg/dL   GFR calc non Af Amer >60 >60 mL/min   GFR calc Af Amer >60 >60 mL/min    Comment: (NOTE) The eGFR has been calculated using the CKD EPI equation. This calculation has not been validated in all clinical  situations. eGFR's persistently <60 mL/min signify possible Chronic Kidney Disease.    Anion gap 7 5 - 15  Ethanol     Status: None   Collection Time: 04/17/15  2:05 PM  Result Value Ref Range   Alcohol, Ethyl (B) <5 <5 mg/dL    Comment:        LOWEST DETECTABLE LIMIT FOR SERUM ALCOHOL IS 5 mg/dL FOR MEDICAL PURPOSES ONLY   Salicylate level     Status: None   Collection Time: 04/17/15  2:05 PM  Result Value Ref Range   Salicylate Lvl <1.9 2.8 - 30.0 mg/dL  Acetaminophen level     Status: Abnormal   Collection Time: 04/17/15  2:05 PM  Result Value Ref Range   Acetaminophen (Tylenol), Serum <10 (L) 10 - 30 ug/mL    Comment:        THERAPEUTIC CONCENTRATIONS VARY SIGNIFICANTLY. A RANGE OF 10-30 ug/mL MAY BE AN EFFECTIVE CONCENTRATION FOR MANY PATIENTS. HOWEVER, SOME ARE BEST TREATED AT CONCENTRATIONS OUTSIDE THIS RANGE. ACETAMINOPHEN CONCENTRATIONS >150 ug/mL AT 4 HOURS AFTER INGESTION AND >50 ug/mL AT 12 HOURS AFTER INGESTION ARE OFTEN ASSOCIATED WITH TOXIC REACTIONS.   Urine rapid drug screen (hosp performed)     Status: Abnormal   Collection Time: 04/17/15  2:25 PM  Result Value Ref Range   Opiates NONE DETECTED NONE DETECTED   Cocaine  NONE DETECTED NONE DETECTED   Benzodiazepines POSITIVE (A) NONE DETECTED   Amphetamines NONE DETECTED NONE DETECTED   Tetrahydrocannabinol POSITIVE (A) NONE DETECTED   Barbiturates NONE DETECTED NONE DETECTED    Comment:        DRUG SCREEN FOR MEDICAL PURPOSES ONLY.  IF CONFIRMATION IS NEEDED FOR ANY PURPOSE, NOTIFY LAB WITHIN 5 DAYS.        LOWEST DETECTABLE LIMITS FOR URINE DRUG SCREEN Drug Class       Cutoff (ng/mL) Amphetamine      1000 Barbiturate      200 Benzodiazepine   409 Tricyclics       811 Opiates          300 Cocaine          300 THC              50   POC Urine Pregnancy, ED (do NOT order at [facility])     Status: None   Collection Time: 04/17/15  2:44 PM  Result Value Ref Range   Preg Test, Ur NEGATIVE NEGATIVE     Comment:        THE SENSITIVITY OF THIS METHODOLOGY IS >24 mIU/mL    Physical Findings: AIMS: Facial and Oral Movements Muscles of Facial Expression: None, normal Lips and Perioral Area: None, normal Jaw: None, normal Tongue: None, normal,Extremity Movements Upper (arms, wrists, hands, fingers): None, normal Lower (legs, knees, ankles, toes): None, normal, Trunk Movements Neck, shoulders, hips: None, normal, Overall Severity Severity of abnormal movements (highest score from questions above): None, normal Incapacitation due to abnormal movements: None, normal Patient's awareness of abnormal movements (rate only patient's report): No Awareness, Dental Status Current problems with teeth and/or dentures?: Yes Does patient usually wear dentures?: No  CIWA:  CIWA-Ar Total: 4 COWS:  COWS Total Score: 1  Treatment Plan Summary: Daily contact with patient to assess and evaluate symptoms and progress in treatment and Medication management:  Plan: Supportive approach/coping skills/relapse prevention           Depression: will continue Paxil 20 mg and continue to work with mindfulness, CBT help  identify the cognitive distortions that keep the depression going.          Discuss other life style changes that can help with both his depression and her drug use such like exercise, meditation           Drug withdrawal symptoms; will continue the detox protocol, will use motivational interviewing and encourage to pursue total abstinence after discharge.           Insomnia: Continue Mirtazapine 7.5 mg q bedtime.           Muscle cramps: Initiate Robaxin 500 mg tid x 3 days.           Continue to educate in terms of AA/NA and other recovery strategies  Medical Decision Making:  Established Problem, Stable/Improving (1), Review of Psycho-Social Stressors (1), Review of Last Therapy Session (1), Independent Review of image, tracing or specimen (2) and Review of Medication Regimen & Side Effects (2)    Encarnacion Slates, PPMHNP, FNP-Bc 04/19/2015, 1:37 PM I agree with assessment and plan Geralyn Flash A. Sabra Heck, M.D.

## 2015-04-19 NOTE — Progress Notes (Signed)
Adult Psychoeducational Group Note  Date:  04/19/2015 Time:  9:05 PM  Group Topic/Focus:  Wrap-Up Group:   The focus of this group is to help patients review their daily goal of treatment and discuss progress on daily workbooks.  Participation Level:  Did Not Attend  Additional Comments:  Pt was invited to attend group, however was not feeling well and stayed in her room.  Caswell CorwinOwen, Oris Calmes C 04/19/2015, 9:05 PM

## 2015-04-19 NOTE — Progress Notes (Signed)
NUTRITION ASSESSMENT  Pt identified as at risk on the Malnutrition Screen Tool  INTERVENTION: 1. Supplements: Continue Ensure Enlive po BID, each supplement provides 350 kcal and 20 grams of protein  NUTRITION DIAGNOSIS: Unintentional weight loss related to sub-optimal intake as evidenced by pt report.   Goal: Pt to meet >/= 90% of their estimated nutrition needs.  Monitor:  PO intake  Assessment:  Pt admitted with depression, anxiety, and substance abuse.  Pt has been ordered Ensure supplements. Per RN notes, pt has reported fair appetite. Pt experiencing withdrawal symptoms.  Per weight history, pt has lost weight over the last 5 years. Unsure of when weight loss began. Pt still would benefit from nutritional supplementation.  Height: Ht Readings from Last 1 Encounters:  04/18/15 5\' 5"  (1.651 m)    Weight: Wt Readings from Last 1 Encounters:  04/18/15 133 lb (60.328 kg)    Weight Hx: Wt Readings from Last 10 Encounters:  04/18/15 133 lb (60.328 kg)  04/17/15 134 lb (60.782 kg)  11/02/09 171 lb (77.565 kg) (93 %*, Z = 1.51)   * Growth percentiles are based on CDC 2-20 Years data.    BMI:  Body mass index is 22.13 kg/(m^2). Pt meets criteria for normal range based on current BMI.  Estimated Nutritional Needs: Kcal: 25-30 kcal/kg Protein: > 1 gram protein/kg Fluid: 1 ml/kcal  Diet Order: Diet regular Room service appropriate?: Yes; Fluid consistency:: Thin Pt is also offered choice of unit snacks mid-morning and mid-afternoon.  Pt is eating as desired.   Lab results and medications reviewed.   Caroline FrancoLindsey Danyel Tobey, MS, RD, LDN Pager: [phone] After Hours Pager: [phone]

## 2015-04-20 DIAGNOSIS — F332 Major depressive disorder, recurrent severe without psychotic features: Principal | ICD-10-CM

## 2015-04-20 MED ORDER — MIRTAZAPINE 15 MG PO TABS
15.0000 mg | ORAL_TABLET | Freq: Every day | ORAL | Status: DC
  Administered 2015-04-20 – 2015-04-21 (×2): 15 mg via ORAL
  Filled 2015-04-20 (×4): qty 1

## 2015-04-20 MED ORDER — GABAPENTIN 100 MG PO CAPS
100.0000 mg | ORAL_CAPSULE | Freq: Three times a day (TID) | ORAL | Status: DC
  Administered 2015-04-20 – 2015-04-21 (×3): 100 mg via ORAL
  Filled 2015-04-20 (×7): qty 1

## 2015-04-20 MED ORDER — METHOCARBAMOL 500 MG PO TABS
500.0000 mg | ORAL_TABLET | Freq: Three times a day (TID) | ORAL | Status: DC | PRN
  Administered 2015-04-20 – 2015-04-21 (×2): 500 mg via ORAL
  Filled 2015-04-20 (×2): qty 1

## 2015-04-20 NOTE — Progress Notes (Signed)
Recreation Therapy Notes  Date: 07.18.16 Time: 9:30 am Location: [address] Group Room  Group Topic: Stress Management  Goal Area(s) Addresses:  Patient will verbalize importance of using healthy stress management.  Patient will identify positive emotions associated with healthy stress management.   Intervention: Stress Management  Activity :  Guided Imagery Script.  LRT introduced and educated patients on the stress management technique of guided imagery.  A script was used to deliver the technique to patients.  Patients were asked to follow the script read a loud by LRT to engage in practicing the stress management technique.  Education:  Stress Management, Discharge Planning.   Education Outcome: Acknowledges edcuation/In group clarification offered/Needs additional education  Clinical Observations/Feedback: Patient did not attend group.   Reeta Kuk, LRT/CTRS         Flem Enderle A 04/20/2015 2:06 PM 

## 2015-04-20 NOTE — Progress Notes (Signed)
D:  Patient's self inventory sheet, patient slept good last night, sleep medication was helpful.  Fair appetite, low energy level, good concentration.  Rated depression and hopeless 5, anxiety 9.  Withdrawals, diarrhea, chilling, cramping, agitation.  Does have tooth pain.  Physical problems, feels shaky, headaches.  Physical pain, worst pain in past 24 hours 5, "all over".  Goal is to get through, more energy.  No discharge plans. A:  Medications administered per MD orders.  Emotional support and encouragement given patient. R:  Denied SI & HI this morning while talking to nurse, contracts for safety.  Denied A/V hallucinations.  Patient denied pain while talking to nurse.  Safety maintained with 15 minute checks.

## 2015-04-20 NOTE — [facility] Group Notes (Signed)
[facility]  04/20/2015 8:45 AM  Participation Quality: Alert, Appropriate and Oriented  Mood/Affect: Appropriate  Depression Rating: "not too bad"  Anxiety Rating: "not too bad"  Thoughts of Suicide: Pt denies SI/HI  Will you contract for safety? Yes  Current AVH: Pt denies  Plan for Discharge/Comments: Pt attended discharge planning group and actively participated in group. CSW discussed suicide prevention education with the group and encouraged them to discuss discharge planning and any relevant barriers. Pt reports improving mood and reports readiness to start discharge plan. Pt reports that she will return home.   Transportation Means: Pt reports access to transportation  Supports: No supports mentioned at this time  Chad CordialLauren Carter, LCSWA 04/20/2015 9:18 AM

## 2015-04-20 NOTE — Progress Notes (Addendum)
Patient ID: Caroline Gaines, female   DOB: 03-08-1991, 24 y.o.   MRN: [id number] [facility] MD Progress Note  04/20/2015 5:24 PM Caroline Gaines  MRN:  [id number]  Subjective:  Patient states " I am feeling better". She reports some vague, mild ongoing symptoms of WDL such as feeling " jittery ", and having some mild aches/pains. She denies medication side effects.    Objective: I have discussed case with treatment team and have met with patient. Patient is a 24 year old single female, who has a history of polysubstance dependence. Benzodiazepines were substance of choice , but had recently started using Heroin, which caused her significant concern. States " I only used it two times , but a friend of mine had died of heroin  Overdose and I ha told myself I would never do that drug". States using heroin made her realize the severity of her substance dependence , and decided to seek treatment. She also reports recent increased depression. At this time she is feeling better- less depressed, and states symptoms of WDL are abating. She has no current significant tremors or diaphoresis, and does not appear in any acute distress .  Denies medication side effects. 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  Principal Problem: Major depressive disorder, recurrent episode, severe  Diagnosis:   Patient Active Problem List   Diagnosis Date Noted  . Major depressive disorder, recurrent episode, severe [F33.2] 04/18/2015  . Generalized anxiety disorder [F41.1] 04/18/2015  . Polysubstance abuse [F19.10] 04/18/2015  . SMOKER [Z72.0] 11/02/2009  . ASTHMA [J45.909] 11/02/2009  . HEADACHE, CHRONIC [R51] 11/02/2009   Total Time spent with patient: 25 minutes   Past Medical History:  Past Medical History  Diagnosis Date  . Asthma   . Endometriosis   . Seizures   . Depression   . Anxiety      Past Surgical History  Procedure Laterality Date  . Endometrial ablation N/A 2012   Family History:  Family History  Problem Relation Age of Onset  . Asthma Father   . Hypertension Father   . Diabetes Mellitus II Father    Social History:  History  Alcohol Use  . Yes    Comment: social drinker only     History  Drug Use  . Yes  . Special: Cocaine, Marijuana, Oxycodone, Benzodiazepines, Heroin, Hydrocodone    Comment: daily use    History   Social History  . Marital Status: Single    Spouse Name: N/A  . Number of Children: N/A  . Years of Education: N/A   Social History Main Topics  . Smoking status: Current Every Day Smoker -- 1.50 packs/day for 9 years    Types: Cigarettes  . Smokeless tobacco: Not on file  . Alcohol Use: Yes     Comment: social drinker only  . Drug Use: Yes    Special: Cocaine, Marijuana, Oxycodone, Benzodiazepines, Heroin, Hydrocodone     Comment: daily use  . Sexual Activity: Yes    Birth Control/ Protection: Condom     Comment: heroin   Other Topics Concern  . None   Social History Narrative   Additional History:    Sleep: Fair  Appetite:  Improving  Musculoskeletal: Strength & Muscle Tone: within normal limits Gait & Station: Weak Patient leans: N/A   Psychiatric Specialty Exam: Physical Exam  Review of Systems  Constitutional: Positive for malaise/fatigue and diaphoresis.  Eyes: Negative.   Respiratory: Negative.   Cardiovascular: Negative.   Gastrointestinal: Positive for nausea.  Genitourinary: Negative.   Musculoskeletal: Positive for myalgias and joint pain.  Skin: Negative.   Neurological: Positive for dizziness, weakness and headaches.  Endo/Heme/Allergies: Negative.   Psychiatric/Behavioral: Positive for depression and substance abuse (Polysubstance dependence). Negative for suicidal ideas, hallucinations and memory loss. The patient is nervous/anxious and has insomnia.     Blood pressure 128/76, pulse 66,  temperature 97.8 F (36.6 C), temperature source Oral, resp. rate 18, height _0  (1.651 m), weight 133 lb (60.328 kg), last menstrual period 04/10/2015, SpO2 100 %.Body mass index is 22.13 kg/(m^2).  General Appearance: Fairly Groomed  Engineer, water::  Good  Speech:  Normal Rate  Volume:  Normal  Mood:  reports she feels better, seems euthymic  Affect:  Appropriate and reactive   Thought Process:  Coherent and Intact  Orientation:  Full (Time, Place, and Person)  Thought Content:  Denies hallucinations, no delusions, not internally preoccupied   Suicidal Thoughts:  No- denies any thoughts of hurting self or of SI   Homicidal Thoughts:  No  Memory:  Grossly intact  Judgement:  Fair  Insight:  Present  Psychomotor Activity:  Normal  Concentration:  Fair  Recall:  Good  Fund of Knowledge:Good  Language: Good  Akathisia:  No  Handed:  Right  AIMS (if indicated):     Assets:  Desire for Improvement  ADL's: improved   Cognition: WNL  Sleep:  Number of Hours: 5.75   Current Medications: Current Facility-Administered Medications  Medication Dose Route Frequency Provider Last Rate Last Dose  . acetaminophen (TYLENOL) tablet 650 mg  650 mg Oral Q6H PRN Harriet Butte, NP   650 mg at 04/20/15 1325  . alum & mag hydroxide-simeth (MAALOX/MYLANTA) 200-200-20 MG/5ML suspension 30 mL  30 mL Oral Q4H PRN Harriet Butte, NP      . chlordiazePOXIDE (LIBRIUM) capsule 25 mg  25 mg Oral Q6H PRN Encarnacion Slates, NP   25 mg at 04/20/15 1444  . chlordiazePOXIDE (LIBRIUM) capsule 25 mg  25 mg Oral BH-qamhs Encarnacion Slates, NP       Followed by  . [START ON 04/22/2015] chlordiazePOXIDE (LIBRIUM) capsule 25 mg  25 mg Oral Daily Encarnacion Slates, NP      . feeding supplement (ENSURE ENLIVE) (ENSURE ENLIVE) liquid 237 mL  237 mL Oral BID BM Myer Peer Cobos, MD   237 mL at 04/20/15 1448  . gabapentin (NEURONTIN) capsule 100 mg  100 mg Oral TID Jenne Campus, MD      . hydrOXYzine (ATARAX/VISTARIL) tablet 25  mg  25 mg Oral Q6H PRN Encarnacion Slates, NP   25 mg at 04/20/15 1325  . loperamide (IMODIUM) capsule 2-4 mg  2-4 mg Oral PRN Encarnacion Slates, NP      . magnesium hydroxide (MILK OF MAGNESIA) suspension 30 mL  30 mL Oral Daily PRN Harriet Butte, NP   30 mL at 04/19/15 1421  . methocarbamol (ROBAXIN) tablet 500 mg  500 mg Oral TID PRN Encarnacion Slates, NP   500 mg at 04/20/15 1538  . mirtazapine (REMERON) tablet 15 mg  15 mg Oral QHS Jenne Campus, MD      . multivitamin with minerals tablet 1 tablet  1 tablet Oral Daily Encarnacion Slates, NP   1 tablet at 04/20/15 0757  . nicotine (NICODERM CQ -  dosed in mg/24 hours) patch 21 mg  21 mg Transdermal Daily Jenne Campus, MD   21 mg at 04/20/15 0756  . ondansetron (ZOFRAN-ODT) disintegrating tablet 4 mg  4 mg Oral Q6H PRN Encarnacion Slates, NP      . PARoxetine (PAXIL) tablet 20 mg  20 mg Oral BH-q7a Harriet Butte, NP   20 mg at 04/20/15 1610  . thiamine (VITAMIN B-1) tablet 100 mg  100 mg Oral Daily Encarnacion Slates, NP   100 mg at 04/20/15 9604    Lab Results:  No results found for this or any previous visit (from the past 48 hour(s)). Physical Findings: AIMS: Facial and Oral Movements Muscles of Facial Expression: None, normal Lips and Perioral Area: None, normal Jaw: None, normal Tongue: None, normal,Extremity Movements Upper (arms, wrists, hands, fingers): None, normal Lower (legs, knees, ankles, toes): None, normal, Trunk Movements Neck, shoulders, hips: None, normal, Overall Severity Severity of abnormal movements (highest score from questions above): None, normal Incapacitation due to abnormal movements: None, normal Patient's awareness of abnormal movements (rate only patient's report): No Awareness, Dental Status Current problems with teeth and/or dentures?: Yes Does patient usually wear dentures?: No  CIWA:  CIWA-Ar Total: 2 COWS:  COWS Total Score: 1   Assessment- patient is improved - mood and affect improved, and today states she feels  better. No SI. Future oriented. No significant withdrawal symptoms at this time. Vitals stable, no significant tremors, diaphoresis or acute distress. Sleep is improved, on Remeron.  She is tolerating Paxil/ Remeron well. Describes some  Residual anxiety.  Patient also describes some chronic knee pain. We discussed options and she is interested in Neurontin, which may help address anxiety, pain .  Treatment Plan Summary: Daily contact with patient to assess and evaluate symptoms and progress in treatment and Medication management:  Plan:            Depression: continue Paxil 20 mg  QDAY .           Drug withdrawal : continue Librium detox protocol.           Nicotine dependence: continue Nicoderm Patch           Chronic knee pain: start Neurontin 100 mgrs TID initially- side effects and rationale discussed            Insomnia: Increase  Mirtazapine to 15 mgrs QHS to address depression, insomnia, anxiety           We discussed option of going to an inpatient rehab after discharge- not interested at this time- continue to encourage  AA/NA  Participation after discharge  Medical Decision Making:  Established Problem, Stable/Improving (1), Review of Psycho-Social Stressors (1), Review of Last Therapy Session (1), Independent Review of image, tracing or specimen (2) and Review of Medication Regimen & Side Effects (2)   Neita Garnet,  MD 04/20/2015, 5:24 PM

## 2015-04-20 NOTE — Progress Notes (Signed)
[facility] Group Notes:  (Nursing/MHT/Case Management/Adjunct)  Date:  04/20/2015  Time:  9:15 PM  Type of Therapy:  Psychoeducational Skills  Participation Level:  Active  Participation Quality:  Appropriate and Attentive  Affect:  Appropriate and Excited  Cognitive:  Appropriate  Insight:  Appropriate and Good  Engagement in Group:  Engaged  Modes of Intervention:  Activity  Summary of Progress/Problems: Pts played a therapeutic activity of Jeopard[employer] Wellness. Pt was engaged and actively participated.  Aubert Choyce C 04/20/2015, 9:15 PM 

## 2015-04-20 NOTE — [facility] Group Notes (Signed)
[facility] LCSW Group Therapy  04/20/2015 1:15pm  Type of Therapy:  Group Therapy vercoming Obstacles  Participation Level:  Minimal  Participation Quality:  Reserved  Affect:  Appropriate  Cognitive:  Appropriate and Oriented  Insight:  Developing/Improving and Improving  Engagement in Therapy:  Improving  Modes of Intervention:  Discussion, Exploration, Problem-solving and Support  Description of Group:   In this group patients will be encouraged to explore what they see as obstacles to their own wellness and recovery. They will be guided to discuss their thoughts, feelings, and behaviors related to these obstacles. The group will process together ways to cope with barriers, with attention given to specific choices patients can make. Each patient will be challenged to identify changes they are motivated to make in order to overcome their obstacles. This group will be process-oriented, with patients participating in exploration of their own experiences as well as giving and receiving support and challenge from other group members.  Summary of Patient Progress: Pt did not participate in group discussion and was observed to be unengaged. When asked about changes that she should make to overcome her obstacles, Pt asked "Can I pass today?"   Therapeutic Modalities:   Cognitive Behavioral Therapy Solution Focused Therapy Motivational Interviewing Relapse Prevention Therapy   Chad CordialLauren Carter, LCSWA 04/20/2015 5:09 PM

## 2015-04-21 MED ORDER — HYDROXYZINE HCL 25 MG PO TABS
25.0000 mg | ORAL_TABLET | Freq: Four times a day (QID) | ORAL | Status: DC | PRN
  Administered 2015-04-21 – 2015-04-22 (×4): 25 mg via ORAL
  Filled 2015-04-21 (×2): qty 1
  Filled 2015-04-21: qty 20
  Filled 2015-04-21 (×2): qty 1

## 2015-04-21 MED ORDER — MELOXICAM 7.5 MG PO TABS
7.5000 mg | ORAL_TABLET | Freq: Every day | ORAL | Status: DC
  Administered 2015-04-21 – 2015-04-22 (×2): 7.5 mg via ORAL
  Filled 2015-04-21 (×5): qty 1

## 2015-04-21 MED ORDER — GABAPENTIN 100 MG PO CAPS
200.0000 mg | ORAL_CAPSULE | Freq: Two times a day (BID) | ORAL | Status: DC
  Administered 2015-04-21 – 2015-04-22 (×2): 200 mg via ORAL
  Filled 2015-04-21 (×3): qty 2
  Filled 2015-04-21: qty 56
  Filled 2015-04-21: qty 2
  Filled 2015-04-21 (×3): qty 56

## 2015-04-21 MED ORDER — HYDROXYZINE HCL 25 MG PO TABS
25.0000 mg | ORAL_TABLET | Freq: Once | ORAL | Status: AC
  Administered 2015-04-21: 25 mg via ORAL
  Filled 2015-04-21 (×2): qty 1

## 2015-04-21 MED ORDER — GABAPENTIN 100 MG PO CAPS
200.0000 mg | ORAL_CAPSULE | Freq: Two times a day (BID) | ORAL | Status: DC
  Filled 2015-04-21 (×4): qty 2

## 2015-04-21 MED ORDER — MIRTAZAPINE 15 MG PO TABS
15.0000 mg | ORAL_TABLET | Freq: Every evening | ORAL | Status: DC | PRN
Start: 2015-04-21 — End: 2015-04-22
  Administered 2015-04-21: 15 mg via ORAL
  Filled 2015-04-21: qty 28
  Filled 2015-04-21: qty 1
  Filled 2015-04-21: qty 28
  Filled 2015-04-21: qty 1
  Filled 2015-04-21 (×2): qty 28
  Filled 2015-04-21 (×2): qty 1

## 2015-04-21 NOTE — Progress Notes (Signed)
Adult Psychoeducational Group Note  Date:  04/21/2015 Time:  0900 Group Topic/Focus:  Diagnosis Education:   The focus of this group is to discuss the major disorders that patients maybe diagnosed with.  Group discusses the importance of knowing what one's diagnosis is so that one can understand treatment and better advocate for oneself.  Participation Level:  Active  Participation Quality:  Appropriate, Sharing and Supportive  Affect:  Appropriate  Cognitive:  Alert and Appropriate  Insight: Appropriate and Good  Engagement in Group:  Improving and Supportive  Modes of Intervention:  Discussion, Education and Support  Additional Comments:  Patient identified goal for today.  Quintella ReichertKnight, Guliana Weyandt [facility] 04/21/2015, 10:01 AM

## 2015-04-21 NOTE — [facility] Group Notes (Signed)
[facility] LCSW Group Therapy 04/21/2015 1:15 PM  Type of Therapy: Group Therapy- Feelings about Diagnosis  Participation Level: Active   Participation Quality:  Appropriate  Affect:  Appropriate  Cognitive: Alert and Oriented   Insight:  Developing   Engagement in Therapy: Developing/Improving and Engaged   Modes of Intervention: Clarification, Confrontation, Discussion, Education, Exploration, Limit-setting, Orientation, Problem-solving, Rapport Building, Dance movement psychotherapisteality Testing, Socialization and Support  Description of Group:   This group will allow patients to explore their thoughts and feelings about diagnoses they have received. Patients will be guided to explore their level of understanding and acceptance of these diagnoses. Facilitator will encourage patients to process their thoughts and feelings about the reactions of others to their diagnosis, and will guide patients in identifying ways to discuss their diagnosis with significant others in their lives. This group will be process-oriented, with patients participating in exploration of their own experiences as well as giving and receiving support and challenge from other group members.  Summary of Progress/Problems:  Pt was more active in group discussion today, however still presents as reserved. Pt was attentive to discussion and was able to identify a positive quality about herself to contrast the way society views her mental illness. Pt described herself as caring.  Therapeutic Modalities:   Cognitive Behavioral Therapy Solution Focused Therapy Motivational Interviewing Relapse Prevention Therapy  Chad CordialLauren Carter, LCSWA 04/21/2015 4:54 PM

## 2015-04-21 NOTE — Progress Notes (Signed)
Recreation Therapy Notes  Animal-Assisted Activity (AAA) Program Checklist/Progress Notes Patient Eligibility Criteria Checklist & Daily Group note for Rec Tx Intervention  Date: 07.19.16 Time: 2:45 pm Location: [address] Dayroom   AAA/T Program Assumption of Risk Form signed by Patient/ or Parent Legal Guardian yes  Patient is free of allergies or sever asthma yes  Patient reports no fear of animals yes  Patient reports no history of cruelty to animalsyes  Patient understands his/her participation is voluntary yes  Patient washes hands before animal contact yes  Patient washes hands after animal contact yes  Behavioral Response: Engaged  Education: Hand Washing, Appropriate Animal Interaction   Education Outcome: Acknowledges understanding/In group clarification offered  Clinical Observations/Feedback: Patient attended group.   Hensley Treat, LRT/CTRS         Sharleen Szczesny A 04/21/2015 3:55 PM 

## 2015-04-21 NOTE — Tx Team (Signed)
Interdisciplinary Treatment Plan Update (Adult) Date: 04/21/2015   Date: 04/21/2015 8:26 AM  Progress in Treatment:  Attending groups: Yes Participating in groups: No Taking medication as prescribed: Yes Tolerating medication: Yes  Family/Significant othe contact made: No, CSW attempting to make contact with brother Patient understands diagnosis: Yes Discussing patient identified problems/goals with staff: Yes  Medical problems stabilized or resolved: Yes  Denies suicidal/homicidal ideation: Yes Patient has not harmed self or Others: Yes   New problem(s) identified: None identified at this time.   Discharge Plan or Barriers: Pt will discharge to her brother's home and [facility] reactivated.   Additional comments:  Patient is 24 YO separated [place] female admitted with diagnosis of Major Depressive Disorder Recurrent, Severe after presenting to ED with SI (plan to jump from moving MV) and worsening substance abuse issues. Patient lives with her brother and works for his company and will discharge there. Currently has no MH providers.   Reason for Continuation of Hospitalization:  Depression Medication stabilization Suicidal ideation Withdrawal symptoms  Estimated length of stay: 1 day  For review of initial/current patient goals, please see plan of care.    Attendees:  Patient:    Family:    Physician: Dr. Jama Flavorsobos MD  04/21/2015 8:26 AM  Nursing:   04/21/2015 8:26 AM  Clinical Social Worker Leotis ShamesLauren Davina Pokearter, LCSWA, MSW 04/21/2015 8:26 AM  Other: Leisa LenzValerie Enoch, [facility] 04/21/2015 8:26 AM  Clinical:  Quintella ReichertBeverly Knight, RN; Earl ManySara Twyman, RN; Jan, RN 04/21/2015 8:26 AM  Other: , RN Charge Nurse 04/21/2015 8:26 AM  Other:     Chad CordialLauren Carter, Theresia MajorsLCSWA MSW

## 2015-04-21 NOTE — Progress Notes (Signed)
Adult Psychoeducational Group Note  Date:  04/21/2015 Time:  8:58 PM  Group Topic/Focus:  Wrap-Up Group:   The focus of this group is to help patients review their daily goal of treatment and discuss progress on daily workbooks.  Participation Level:  Minimal  Participation Quality:  Inattentive and Resistant  Affect:  Blunted, Defensive, Depressed, Flat, Irritable, Labile and Resistant  Cognitive:  Appropriate  Insight: Limited  Engagement in Group:  Lacking, Limited, Poor and Resistant  Modes of Intervention:  Activity  Additional Comments:  Pts played a game of Mental Health Jeopardy. Pt attended, however sat in a corner with her head down not making eye contact and only spoke when spoken to.  Caswell CorwinOwen, Everlee Quakenbush C 04/21/2015, 8:58 PM

## 2015-04-21 NOTE — Progress Notes (Signed)
Patient ID: Caroline Gaines, female   DOB: [birth year], 24 y.o.   MRN: [id number] [facility] MD Progress Note  04/21/2015 7:55 PM Caroline Gaines  MRN:  [id number]  Subjective:   Patient is reporting some improvement. She states she is less depressed. She reports  anxiety, which she attributes partly to her psychosocial stressors, and partly due to knee pain, which is chronic. Describes  It as affecting her R knee and having a " shooting " quality. She also tends to worry about disposition planning, and ruminates about whether she will get to see a psychiatrist and a therapist after discharge .    Objective: I have discussed case with treatment team and have met with patient.  Patient presents improved today compared to admission. Presents calmer, and states she feels medications are helping. Does describe, as above, some ongoing anxiety, and ruminates about medications for anxiety, schedules of these medications and about her knee pain. Does not currently appear to be in any acute distress or discomfort and gait is normal.  No current  Symptoms of WDL.    Denies medication side effects. We have reviewed disposition plans - states she plans to live with one of her brothers, and states her brothers are very supportive . She does, as above, have significant anxiety, although not presenting with any psychomotor restlessness today. She ruminates about disposition plans, about knee pain. She does respond to support , empathy, reassurance. States Vistaril PRNs have been helping.   Principal Problem: Major depressive disorder, recurrent episode, severe  Diagnosis:   Patient Active Problem List   Diagnosis Date Noted  . Major depressive disorder, recurrent episode, severe [F33.2] 04/18/2015  . Generalized anxiety disorder [F41.1] 04/18/2015  . Polysubstance abuse [F19.10] 04/18/2015  . SMOKER [Z72.0] 11/02/2009  . ASTHMA [J45.909] 11/02/2009  . HEADACHE, CHRONIC [R51] 11/02/2009   Total Time spent with  patient: 25 minutes   Past Medical History:  Past Medical History  Diagnosis Date  . Asthma   . Endometriosis   . Seizures   . Depression   . Anxiety     Past Surgical History  Procedure Laterality Date  . Endometrial ablation N/A 2012   Family History:  Family History  Problem Relation Age of Onset  . Asthma Father   . Hypertension Father   . Diabetes Mellitus II Father    Social History:  History  Alcohol Use  . Yes    Comment: social drinker only     History  Drug Use  . Yes  . Special: Cocaine, Marijuana, Oxycodone, Benzodiazepines, Heroin, Hydrocodone    Comment: daily use    History   Social History  . Marital Status: Single    Spouse Name: N/A  . Number of Children: N/A  . Years of Education: N/A   Social History Main Topics  . Smoking status: Current Every Day Smoker -- 1.50 packs/day for 9 years    Types: Cigarettes  . Smokeless tobacco: Not on file  . Alcohol Use: Yes     Comment: social drinker only  . Drug Use: Yes    Special: Cocaine, Marijuana, Oxycodone, Benzodiazepines, Heroin, Hydrocodone     Comment: daily use  . Sexual Activity: Yes    Birth Control/ Protection: Condom     Comment: heroin   Other Topics Concern  . None   Social History Narrative   Additional History:    Sleep:  Improving   Appetite:  Improving  Musculoskeletal: Strength & Muscle Tone: within normal limits Gait &  Station: Weak Patient leans: N/A   Psychiatric Specialty Exam: Physical Exam  Review of Systems  Constitutional: Positive for malaise/fatigue and diaphoresis.  Eyes: Negative.   Respiratory: Negative.   Cardiovascular: Negative.   Gastrointestinal: Positive for nausea.  Genitourinary: Negative.   Musculoskeletal: Positive for myalgias and joint pain.  Skin: Negative.   Neurological: Positive for dizziness, weakness and headaches.  Endo/Heme/Allergies: Negative.   Psychiatric/Behavioral: Positive for depression and substance abuse  (Polysubstance dependence). Negative for suicidal ideas, hallucinations and memory loss. The patient is nervous/anxious and has insomnia.     Blood pressure 104/61, pulse 68, temperature 98.3 F (36.8 C), temperature source Oral, resp. rate 18, height _0  (1.651 m), weight 133 lb (60.328 kg), last menstrual period 04/10/2015, SpO2 100 %.Body mass index is 22.13 kg/(m^2).  General Appearance: improved grooming  Eye Contact::  Good  Speech:  Normal Rate  Volume:  Normal  Mood:  reports she feels better, seems euthymic  Affect:  Less depressed,anxious   Thought Process:  Coherent and Intact  Orientation:  Full (Time, Place, and Person)  Thought Content:  Denies hallucinations, no delusions, not internally preoccupied   Suicidal Thoughts:  No- denies any thoughts of hurting self or of SI   Homicidal Thoughts:  No  Memory:  Grossly intact  Judgement:  Fair  Insight:  Present  Psychomotor Activity:  Normal  Concentration:  Fair  Recall:  Good  Fund of Knowledge:Good  Language: Good  Akathisia:  No  Handed:  Right  AIMS (if indicated):     Assets:  Desire for Improvement  ADL's: improved   Cognition: WNL  Sleep:  Number of Hours: 5.5   Current Medications: Current Facility-Administered Medications  Medication Dose Route Frequency Provider Last Rate Last Dose  . acetaminophen (TYLENOL) tablet 650 mg  650 mg Oral Q6H PRN Harriet Butte, NP   650 mg at 04/21/15 1617  . alum & mag hydroxide-simeth (MAALOX/MYLANTA) 200-200-20 MG/5ML suspension 30 mL  30 mL Oral Q4H PRN Harriet Butte, NP      . Derrill Memo ON 04/22/2015] chlordiazePOXIDE (LIBRIUM) capsule 25 mg  25 mg Oral Daily Encarnacion Slates, NP      . feeding supplement (ENSURE ENLIVE) (ENSURE ENLIVE) liquid 237 mL  237 mL Oral BID BM Jenne Campus, MD   237 mL at 04/21/15 0838  . [START ON 04/22/2015] gabapentin (NEURONTIN) capsule 200 mg  200 mg Oral BID Myer Peer Indie Boehne, MD      . hydrOXYzine (ATARAX/VISTARIL) tablet 25 mg  25 mg Oral  Q6H PRN Jenne Campus, MD   25 mg at 04/21/15 1848  . magnesium hydroxide (MILK OF MAGNESIA) suspension 30 mL  30 mL Oral Daily PRN Harriet Butte, NP   30 mL at 04/19/15 1421  . meloxicam (MOBIC) tablet 7.5 mg  7.5 mg Oral Daily Myer Peer Lexany Belknap, MD      . mirtazapine (REMERON) tablet 15 mg  15 mg Oral QHS Jenne Campus, MD   15 mg at 04/20/15 2140  . multivitamin with minerals tablet 1 tablet  1 tablet Oral Daily Encarnacion Slates, NP   1 tablet at 04/21/15 0807  . nicotine (NICODERM CQ - dosed in mg/24 hours) patch 21 mg  21 mg Transdermal Daily Jenne Campus, MD   21 mg at 04/21/15 0815  . PARoxetine (PAXIL) tablet 20 mg  20 mg Oral BH-q7a Harriet Butte, NP   20 mg at 04/21/15 3009  . thiamine (  VITAMIN B-1) tablet 100 mg  100 mg Oral Daily Encarnacion Slates, NP   100 mg at 04/21/15 1245    Lab Results:  No results found for this or any previous visit (from the past 48 hour(s)). Physical Findings: AIMS: Facial and Oral Movements Muscles of Facial Expression: None, normal Lips and Perioral Area: None, normal Jaw: None, normal Tongue: None, normal,Extremity Movements Upper (arms, wrists, hands, fingers): None, normal Lower (legs, knees, ankles, toes): None, normal, Trunk Movements Neck, shoulders, hips: None, normal, Overall Severity Severity of abnormal movements (highest score from questions above): None, normal Incapacitation due to abnormal movements: None, normal Patient's awareness of abnormal movements (rate only patient's report): No Awareness, Dental Status Current problems with teeth and/or dentures?: Yes Does patient usually wear dentures?: No  CIWA:  CIWA-Ar Total: 1 COWS:  COWS Total Score: 2   Assessment- patient is  Improving- mood is improved and currently denies significant depression. She is not presenting with any WDL symptoms at present. She does present with anxiety, and tends to worry and ruminate about different issues, such as disposition planning and knee  pain. She is planning to live with  A brother after discharge and states he affords her a stable, sober setting.   Treatment Plan Summary: Daily contact with patient to assess and evaluate symptoms and progress in treatment and Medication management:  Plan:            Depression: continue Paxil 20 mg  QDAY .           Drug withdrawal : continue Librium detox protocol.           Nicotine dependence: continue Nicoderm Patch           Chronic knee pain: increase Neurontin to 200 mgrs BID, also start Mobic 7.5 mgrs QDAY ,  side effects and rationale discussed            Insomnia: Continue   Mirtazapine to 15 mgrs QHS to address depression, insomnia, anxiety           continue to encourage sobriety and   AA/NA  Participation after discharge  Medical Decision Making:  Established Problem, Stable/Improving (1), Review of Psycho-Social Stressors (1), Review of Last Therapy Session (1), Independent Review of image, tracing or specimen (2) and Review of Medication Regimen & Side Effects (2)   Neita Garnet,  MD 04/21/2015, 7:55 PM

## 2015-04-21 NOTE — Progress Notes (Signed)
D:  Patient's self inventory sheet, patient sleeps good, sleep medication is helpful.  Fair appetite, normal energy level, good concentration.  Rated depression 2, denied hopeless, anxiety 5.  Withdrawals chilling, body aches.  Denied SI.  Physical problems, pain.  Worst pain #6, all over, right leg/arm.  No pain medication.  Goal is to clean room, fold clothes, attend meetings.  Does have discharge plans. A:  Medications administered per MD orders.  Emotional support and encouragement given patient. R:  Denied SI and HI, contracts for safety.  Denied A/V hallucinations.  Safety maintained with 15 minute checks.

## 2015-04-22 DIAGNOSIS — F332 Major depressive disorder, recurrent severe without psychotic features: Secondary | ICD-10-CM | POA: Insufficient documentation

## 2015-04-22 MED ORDER — GABAPENTIN 100 MG PO CAPS: 200.0000 mg | ORAL_CAPSULE | Freq: Two times a day (BID) | ORAL | Status: DC

## 2015-04-22 MED ORDER — PAROXETINE HCL 20 MG PO TABS: 20.0000 mg | ORAL_TABLET | ORAL | Status: DC

## 2015-04-22 MED ORDER — HYDROXYZINE HCL 25 MG PO TABS: 25.0000 mg | ORAL_TABLET | Freq: Four times a day (QID) | ORAL | Status: DC | PRN

## 2015-04-22 MED ORDER — MIRTAZAPINE 15 MG PO TABS: 15.0000 mg | ORAL_TABLET | Freq: Every evening | ORAL | Status: DC | PRN

## 2015-04-22 MED ORDER — NICOTINE 21 MG/24HR TD PT24: 21.0000 mg | MEDICATED_PATCH | Freq: Every day | TRANSDERMAL | Status: DC

## 2015-04-22 NOTE — Progress Notes (Signed)
Discharge note: Patient discharged per MD order. Discharge summary reviewed with pt. Pt verbally expresses understanding of discharge and signs that she understands discharge summary and follow up care. Medication regimen reviewed with pt. RX and sample medication provided- pt verbalizes understanding. Pt denies SI/HI and AVH at discharge.  All items from locker returned to patient. Patient verbalizes and signs that she received all items out of locker. Brother in lobby for discharge. Ambulatory out of facility.

## 2015-04-22 NOTE — [facility] Suicide Risk Assessment (Signed)
[facility] Discharge Suicide Risk Assessment   Demographic Factors:  24 year old married female, going through divorce, no children, will be living with brother  Total Time spent with patient: 30 minutes  Musculoskeletal: Strength & Muscle Tone: within normal limits Gait & Station: normal Patient leans: N/A  Psychiatric Specialty Exam: Physical Exam  ROS  Blood pressure 105/68, pulse 70, temperature 97.8 F (36.6 C), temperature source Oral, resp. rate 18, height  (1.651 m), weight 133 lb (60.328 kg), last menstrual period 04/10/2015, SpO2 100 %.Body mass index is 22.13 kg/(m^2).  General Appearance: improved grooming  Eye Contact::  Good  Speech:  Normal Rate409  Volume:  Normal  Mood:  Euthymic  Affect:  Appropriate and slightly anxious, but less than before   Thought Process:  Goal Directed and Linear  Orientation:  Full (Time, Place, and Person)  Thought Content:  no hallucinations, no delusions   Suicidal Thoughts:  No  Homicidal Thoughts:  No  Memory:  recent and remote grossly intact   Judgement:  Other:  improved   Insight:  Present  Psychomotor Activity:  Normal  Concentration:  Good  Recall:  Good  Fund of Knowledge:Good  Language: Good  Akathisia:  Negative  Handed:  Right  AIMS (if indicated):     Assets:  Communication Skills Desire for Improvement Resilience Social Support  Sleep:  Number of Hours: 6.25  Cognition: WNL  ADL's: improved    Have you used any form of tobacco in the last 30 days? (Cigarettes, Smokeless Tobacco, Cigars, and/or Pipes): Yes  Has this patient used any form of tobacco in the last 30 days? (Cigarettes, Smokeless Tobacco, Cigars, and/or Pipes) patient interested in continuing nicotine patches for smoking cessation efforts .  Mental Status Per Nursing Assessment::   On Admission:  Suicidal ideation indicated by patient  Current Mental Status by Physician: Alert and attentive, well related, pleasant, mood improved, at this time  euthymic, affect appropriate, reactive, less anxious, no thought disorder, no SI, no HI. No psychotic symptoms.   Loss Factors: Divorce process, had recently started abusing heroin, friend had died from overdose    Historical Factors: History of Substance Abuse, history of chronic anxiety, has been diagnosed GAD.   Risk Reduction Factors:   Sense of responsibility to family, Living with another person, especially a relative, Positive social support and Positive coping skills or problem solving skills  Continued Clinical Symptoms:  Improved   Cognitive Features That Contribute To Risk:  At this time patient much improved.   Suicide Risk:  Mild:  Suicidal ideation of limited frequency, intensity, duration, and specificity.  There are no identifiable plans, no associated intent, mild dysphoria and related symptoms, good self-control (both objective and subjective assessment), few other risk factors, and identifiable protective factors, including available and accessible social support.  Principal Problem: Major depressive disorder, recurrent episode, severe Discharge Diagnoses:  Patient Active Problem List   Diagnosis Date Noted  . Major depressive disorder, recurrent episode, severe [F33.2] 04/18/2015  . Generalized anxiety disorder [F41.1] 04/18/2015  . Polysubstance abuse [F19.10] 04/18/2015  . SMOKER [Z72.0] 11/02/2009  . ASTHMA [J45.909] 11/02/2009  . HEADACHE, CHRONIC [R51] 11/02/2009    Follow-up Information    Follow up with [facility].   Specialty:  Behavioral Health   Why:  Please walk-in between 8am-3pm Monday-Friday to be set up with a doctor and a therapist.   Contact information:   [address] [place] [address] [phone]  Plan Of Care/Follow-up recommendations:  Activity:  as tolerated Diet:  Regular Tests:  NA Other:  see below  Is patient on multiple antipsychotic therapies at discharge:  No   Has Patient had three or more failed trials of  antipsychotic monotherapy by history:  No  Recommended Plan for Multiple Antipsychotic Therapies: NA  Patient is leaving in good spirits. Plans to live with brother. Plans to follow up as above. Encouraged to follow up with NA  Meetings.   Anneli Bing 04/22/2015, 11:39 AM

## 2015-04-22 NOTE — Progress Notes (Signed)
At the beginning of the shift, writer was talking with another pt when this pt came up, and said she had just been with the MD, and that he said she could have her medicine "right now".  Writer told pt that she would be with her in a minute.  Pt continued to be intrusive.  She said she was supposed to get her pain med that had been changed and a Vistaril.  When writer checked, it was noted that pt had received a Vistaril at [phone] which made it too early for another.  Writer explained this to pt.  She became irritated and started complaining.  Writer then went to Dr Jama Flavorsobos who initially said no to a dose at this time.  Pt continued to complain, so writer went back to MD who gave order for pt to get a one time dose at this time.  Med was given to pt, but she continued to be irritable, even through the group per MHT.  Writer explained to pt how meds were ordered and that RNs had to follow a protocol to administer the medications.  Later in the evening, pt came and apologized for her behavior.  Meds were given as ordered, and pt also received a repeat of her mirtazapine for sleep.  Pt denies SI/HI/AVH.  She makes her needs known to staff.  She is attending groups.   She does not plan to go to long term treatment, but plans to go live with her brother who is starting a business and wants her to work for him.  Support and encouragement offered.  Safety maintained with q15 minute checks.

## 2015-04-22 NOTE — Progress Notes (Signed)
Recreation Therapy Notes  Date: 07.20.16 Time: 9:30 am Location: [address] Group Room  Group Topic: Stress Management  Goal Area(s) Addresses:  Patient will verbalize importance of using healthy stress management.  Patient will identify positive emotions associated with healthy stress management.   Intervention: Stress Management  Activity :  Progressive Muscle Relaxation.  LRT will introduce and educate patients on the stress management technique of progressive muscle relaxation.  A script was used to present  the technique to the patients.  Patients were asked to follow a long with the script read a loud by the LRT.  Education:  Stress Management, Discharge Planning.   Education Outcome: Acknowledges edcuation/In group clarification offered/Needs additional education  Clinical Observations/Feedback: Patient did not attend group.   Amaad Byers, LRT/CTRS         Zurii Hewes A 04/22/2015 10:09 AM 

## 2015-04-22 NOTE — Progress Notes (Addendum)
  [facility] :  Will you be returning to the same living situation after discharge:  Yes,  Pt will return to brother's home At discharge, do you have transportation home?: Yes,  Pt's brother to provide transportation Do you have the ability to pay for your medications: Yes,  Pt provided with supply and prescriptions  Release of information consent forms completed and in the chart;  Patient's signature needed at discharge.  Patient to Follow up at: Follow-up Information    [facility]   Specialty:  Behavioral Health   Why:  Please walk-in between 8am-3pm Monday-Friday to be set up with a doctor and a therapist.   Contact information:   [address] [place] [address] [phone]       Patient denies SI/HI: Yes,  Pt denies    Safety Planning and Suicide Prevention discussed: Yes,  with brother. See SPE note for further information  Have you used any form of tobacco in the last 30 days? (Cigarettes, Smokeless Tobacco, Cigars, and/or Pipes): Yes  Has patient been referred to the Quitline?: Yes, faxed on 04/22/15  Elaina HoopsCarter, Ahmiyah Coil M 04/22/2015, 10:21 AM

## 2015-04-22 NOTE — Progress Notes (Signed)
D: Per patient self inventory form patient reports she slept good last night with the use of sleep medication. She reports a good appetite, normal energy level, good concentration. She rates depression 2/10, hopelessness 0/10, anxiety 4/10; all on 0-10 scale, 10 being the worse. Patient denies SI/HI. She c/o chronic right leg pain. Patient reports her goal for the day " Avoid any substance, not even ciggs, see family start therapy, get med correct." Patient reports she is eager for discharge. States she is happy to be able to be with her family. Observed socializing with peers in the dayroom.  A:Special checks q 15 mins in place for safety. MedicationS administered as ordered by MD (see eMAR) Encouragement and counseling provided. Discharge planning in place.  R:Safety maintained. Complaint with medication regimen. Reports pain relief from prescribed medication regimen.  Will continue to monitor.

## 2015-04-22 NOTE — [facility] Suicide Risk Assessment (Signed)
[facility] INPATIENT:  Family/Significant Other Suicide Prevention Education  Suicide Prevention Education:  Education Completed; Arturo MortonKamran Yousafza, Pt's brother [phone]),  (name of family member/significant other) has been identified by the patient as the family member/significant other with whom the patient will be residing, and identified as the person(s) who will aid the patient in the event of a mental health crisis (suicidal ideations/suicide attempt).  With written consent from the patient, the family member/significant other has been provided the following suicide prevention education, prior to the and/or following the discharge of the patient.  The suicide prevention education provided includes the following:  Suicide risk factors  Suicide prevention and interventions  National Suicide Hotline telephone number  [facility] assessment telephone number  [facility] Assistance 911  [facility] and/or [facility] telephone number  Request made of family/significant other to:  Remove weapons (e.g., guns, rifles, knives), all items previously/currently identified as safety concern.    Remove drugs/medications (over-the-counter, prescriptions, illicit drugs), all items previously/currently identified as a safety concern.  The family member/significant other verbalizes understanding of the suicide prevention education information provided.  The family member/significant other agrees to remove the items of safety concern listed above.  Elaina Hoopsarter, Ashya Nicolaisen M 04/22/2015, 9:28 AM

## 2015-04-22 NOTE — Discharge Summary (Signed)
Physician Discharge Summary Note  Patient:  Caroline Gaines is an 24 y.o., female MRN:  [id number] DOB:  [birth year] Patient phone:  [phone] (home)  Patient address:   [address] [place] [address],  Total Time spent with patient: 45 minutes  Date of Admission:  04/18/2015 Date of Discharge: 04/22/2015  Reason for Admission:  Hearing voices, substance abuse  Principal Problem: Major depressive disorder, recurrent episode, severe Discharge Diagnoses: Patient Active Problem List   Diagnosis Date Noted  . Major depressive disorder, recurrent, severe without psychotic features [F33.2]   . Major depressive disorder, recurrent episode, severe [F33.2] 04/18/2015  . Generalized anxiety disorder [F41.1] 04/18/2015  . Polysubstance abuse [F19.10] 04/18/2015  . SMOKER [Z72.0] 11/02/2009  . ASTHMA [J45.909] 11/02/2009  . HEADACHE, CHRONIC [R51] 11/02/2009    Musculoskeletal: Strength & Muscle Tone: within normal limits Gait & Station: normal Patient leans: N/A  Psychiatric Specialty Exam:  SEE SRA Physical Exam  Vitals reviewed. Psychiatric: Her mood appears anxious.    Review of Systems  All other systems reviewed and are negative.   Blood pressure 114/68, pulse 62, temperature 97.8 F (36.6 C), temperature source Oral, resp. rate 18, height 5\' 5"  (1.651 m), weight 60.328 kg (133 lb), last menstrual period 04/10/2015, SpO2 100 %.Body mass index is 22.13 kg/(m^2).  Have you used any form of tobacco in the last 30 days? (Cigarettes, Smokeless Tobacco, Cigars, and/or Pipes): Yes  Has this patient used any form of tobacco in the last 30 days? (Cigarettes, Smokeless Tobacco, Cigars, and/or Pipes) Yes, Prescription not provided because: samples and Rx given to patient  Past Medical History:  Past Medical History  Diagnosis Date  . Asthma   . Endometriosis   . Seizures   . Depression   . Anxiety     Past Surgical History  Procedure Laterality Date  . Endometrial  ablation N/A 2012   Family History:  Family History  Problem Relation Age of Onset  . Asthma Father   . Hypertension Father   . Diabetes Mellitus II Father    Social History:  History  Alcohol Use  . Yes    Comment: social drinker only     History  Drug Use  . Yes  . Special: Cocaine, Marijuana, Oxycodone, Benzodiazepines, Heroin, Hydrocodone    Comment: daily use    History   Social History  . Marital Status: Single    Spouse Name: N/A  . Number of Children: N/A  . Years of Education: N/A   Social History Main Topics  . Smoking status: Current Every Day Smoker -- 1.50 packs/day for 9 years    Types: Cigarettes  . Smokeless tobacco: Not on file  . Alcohol Use: Yes     Comment: social drinker only  . Drug Use: Yes    Special: Cocaine, Marijuana, Oxycodone, Benzodiazepines, Heroin, Hydrocodone     Comment: daily use  . Sexual Activity: Yes    Birth Control/ Protection: Condom     Comment: heroin   Other Topics Concern  . None   Social History Narrative   Risk to Self: Is patient at risk for suicide?: Yes What has been your use of drugs/alcohol within the last 12 months?: Pt reports she began using THC daily in high school followed by pain pills prescribed (for endimetriosis), "socially' uses alcohol. Pt reports current use of opiates has increased in addition to daily THC she is now using "4-5 bars Xanax" has smoked Heroin twice in the last month, used  Mollies and Cocaine.  Risk to Others:   Prior Inpatient Therapy:   Prior Outpatient Therapy:    Level of Care:  OP  Hospital Course:  Lanyiah 'Althea Grimmer' is a 24 year old Parkinsonian female.  Initially seen at [facility] ED with complaints of; mainly requesting help to get off of Xanax because she had a seizures in the past trying to get off of Xanax.   Sharl Ma was admitted for Major depressive disorder, recurrent episode, severe and crisis management.  She was treated discharged with the medications  listed below under Medication List.  Medical problems were identified and treated as needed.  Home medications were restarted as appropriate.  Improvement was monitored by observation and Sharl Ma daily report of symptom reduction.  Emotional and mental status was monitored by daily self-inventory reports completed by Sharl Ma and clinical staff.         Sharl Ma was evaluated by the treatment team for stability and plans for continued recovery upon discharge.  Sharl Ma motivation was an integral factor for scheduling further treatment.  Employment, transportation, bed availability, health status, family support, and any pending legal issues were also considered during her hospital stay.  She was offered further treatment options upon discharge including but not limited to Residential, Intensive Outpatient, and Outpatient treatment.  Sharl Ma will follow up with the services as listed below under Follow Up Information.     Upon completion of this admission the patient was both mentally and medically stable for discharge denying suicidal/homicidal ideation, auditory/visual/tactile hallucinations, delusional thoughts and paranoia.      Consults:  psychiatry  Significant Diagnostic Studies:  labs: per ED  Discharge Vitals:   Blood pressure 114/68, pulse 62, temperature 97.8 F (36.6 C), temperature source Oral, resp. rate 18, height  (1.651 m), weight 60.328 kg (133 lb), last menstrual period 04/10/2015, SpO2 100 %. Body mass index is 22.13 kg/(m^2). Lab Results:   No results found for this or any previous visit (from the past 72 hour(s)).  Physical Findings: AIMS: Facial and Oral Movements Muscles of Facial Expression: None, normal Lips and Perioral Area: None, normal Jaw: None, normal Tongue: None, normal,Extremity Movements Upper (arms, wrists, hands, fingers): None, normal Lower (legs, knees, ankles, toes): None, normal, Trunk Movements Neck,  shoulders, hips: None, normal, Overall Severity Severity of abnormal movements (highest score from questions above): None, normal Incapacitation due to abnormal movements: None, normal Patient's awareness of abnormal movements (rate only patient's report): No Awareness, Dental Status Current problems with teeth and/or dentures?: Yes Does patient usually wear dentures?: No  CIWA:  CIWA-Ar Total: 1 COWS:  COWS Total Score: 2   See Psychiatric Specialty Exam and Suicide Risk Assessment completed by Attending Physician prior to discharge.  Discharge destination:  Home  Is patient on multiple antipsychotic therapies at discharge:  No   Has Patient had three or more failed trials of antipsychotic monotherapy by history:  No    Recommended Plan for Multiple Antipsychotic Therapies: NA     Medication List    STOP taking these medications        ADVIL PM 200-38 MG Tabs  Generic drug:  Ibuprofen-Diphenhydramine Cit     alprazolam 2 MG tablet  Commonly known as:  XANAX      TAKE these medications      Indication   gabapentin 100 MG capsule  Commonly known as:  NEURONTIN  Take 2 capsules (200 mg total) by mouth 2 (two) times daily.  Indication:  Agitation, Neuropathic Pain     hydrOXYzine 25 MG tablet  Commonly known as:  ATARAX/VISTARIL  Take 1 tablet (25 mg total) by mouth every 6 (six) hours as needed for anxiety.   Indication:  Anxiety Neurosis     mirtazapine 15 MG tablet  Commonly known as:  REMERON  Take 1 tablet (15 mg total) by mouth at bedtime and may repeat dose one time if needed.   Indication:  Trouble Sleeping     nicotine 21 mg/24hr patch  Commonly known as:  NICODERM CQ - dosed in mg/24 hours  Place 1 patch (21 mg total) onto the skin daily.   Indication:  Nicotine Addiction     PARoxetine 20 MG tablet  Commonly known as:  PAXIL  Take 1 tablet (20 mg total) by mouth every morning.   Indication:  Major Depressive Disorder           Follow-up  Information    [facility]   Specialty:  Behavioral Health   Why:  Please walk-in between 8am-3pm Monday-Friday to be set up with a doctor and a therapist.   Contact information:   [address] [place] [id number] [phone]       Follow-up recommendations:  Activity:  as tol, diet as tol  Comments:  1.  Take all your medications as prescribed.              2.  Report any adverse side effects to outpatient provider.                       3.  Patient instructed to not use alcohol or illegal drugs while on prescription medicines.            4.  In the event of worsening symptoms, instructed patient to call 911, the crisis hotline or go to nearest emergency room for evaluation of symptoms.  Total Discharge Time:  40 min  Signed: Velna HatchetSheila May Agustin AGNP-BC 04/22/2015, 2:39 PM   Patient seen, Suicide Assessment Completed.  Disposition Plan Reviewed

## 2015-08-20 ENCOUNTER — Encounter (HOSPITAL_COMMUNITY): Payer: Self-pay

## 2015-08-20 ENCOUNTER — Emergency Department (HOSPITAL_COMMUNITY)
Admission: EM | Admit: 2015-08-20 | Discharge: 2015-08-20 | Discharge status: Against Medical Advice | Payer: Self-pay | Attending: Emergency Medicine | Admitting: Emergency Medicine

## 2015-08-20 DIAGNOSIS — N939 Abnormal uterine and vaginal bleeding, unspecified: Secondary | ICD-10-CM | POA: Insufficient documentation

## 2015-08-20 DIAGNOSIS — F17210 Nicotine dependence, cigarettes, uncomplicated: Secondary | ICD-10-CM | POA: Insufficient documentation

## 2015-08-20 DIAGNOSIS — J45909 Unspecified asthma, uncomplicated: Secondary | ICD-10-CM | POA: Insufficient documentation

## 2015-08-20 LAB — CBC WITH DIFFERENTIAL/PLATELET
BASOS PCT: 1 %
Basophils Absolute: 0 10*3/uL (ref 0.0–0.1)
Eosinophils Absolute: 0.2 10*3/uL (ref 0.0–0.7)
Eosinophils Relative: 3 %
HEMATOCRIT: 38.5 % (ref 36.0–46.0)
Hemoglobin: 12.2 g/dL (ref 12.0–15.0)
Lymphocytes Relative: 49 %
Lymphs Abs: 3.2 10*3/uL (ref 0.7–4.0)
MCH: 26.5 pg (ref 26.0–34.0)
MCHC: 31.7 g/dL (ref 30.0–36.0)
MCV: 83.7 fL (ref 78.0–100.0)
MONO ABS: 0.3 10*3/uL (ref 0.1–1.0)
Monocytes Relative: 4 %
NEUTROS ABS: 2.8 10*3/uL (ref 1.7–7.7)
Neutrophils Relative %: 43 %
PLATELETS: 240 10*3/uL (ref 150–400)
RBC: 4.6 MIL/uL (ref 3.87–5.11)
RDW: 12.7 % (ref 11.5–15.5)
WBC: 6.5 10*3/uL (ref 4.0–10.5)

## 2015-08-20 LAB — COMPREHENSIVE METABOLIC PANEL
ALBUMIN: 3.8 g/dL (ref 3.5–5.0)
ALT: 17 U/L (ref 14–54)
AST: 17 U/L (ref 15–41)
Alkaline Phosphatase: 39 U/L (ref 38–126)
Anion gap: 5 (ref 5–15)
BILIRUBIN TOTAL: 0.4 mg/dL (ref 0.3–1.2)
BUN: 10 mg/dL (ref 6–20)
CO2: 26 mmol/L (ref 22–32)
Calcium: 8.9 mg/dL (ref 8.9–10.3)
Chloride: 107 mmol/L (ref 101–111)
Creatinine, Ser: 0.55 mg/dL (ref 0.44–1.00)
GFR calc Af Amer: >60 mL/min (ref >60)
GFR calc non Af Amer: >60 mL/min (ref >60)
GLUCOSE: 88 mg/dL (ref 65–99)
POTASSIUM: 3.9 mmol/L (ref 3.5–5.1)
Sodium: 138 mmol/L (ref 135–145)
TOTAL PROTEIN: 6.6 g/dL (ref 6.5–8.1)

## 2015-08-20 NOTE — ED Notes (Signed)
Called out for patient to go back to a room with no response x 2, will reattempt when next room becomes available
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
## 2015-08-22 ENCOUNTER — Inpatient Hospital Stay (HOSPITAL_COMMUNITY)
Admission: AD | Admit: 2015-08-22 | Discharge: 2015-08-22 | Disposition: A | Discharge status: Home / Self Care | Payer: Self-pay | Source: Ambulatory Visit | Attending: Obstetrics and Gynecology | Admitting: Obstetrics and Gynecology

## 2015-08-22 ENCOUNTER — Encounter (HOSPITAL_COMMUNITY): Payer: Self-pay | Admitting: *Deleted

## 2015-08-22 DIAGNOSIS — F1910 Other psychoactive substance abuse, uncomplicated: Secondary | ICD-10-CM | POA: Insufficient documentation

## 2015-08-22 DIAGNOSIS — F332 Major depressive disorder, recurrent severe without psychotic features: Secondary | ICD-10-CM | POA: Insufficient documentation

## 2015-08-22 DIAGNOSIS — F17210 Nicotine dependence, cigarettes, uncomplicated: Secondary | ICD-10-CM | POA: Insufficient documentation

## 2015-08-22 DIAGNOSIS — R109 Unspecified abdominal pain: Secondary | ICD-10-CM

## 2015-08-22 DIAGNOSIS — R1084 Generalized abdominal pain: Secondary | ICD-10-CM

## 2015-08-22 DIAGNOSIS — G8929 Other chronic pain: Secondary | ICD-10-CM

## 2015-08-22 HISTORY — DX: Other psychoactive substance abuse, uncomplicated: F19.10

## 2015-08-22 LAB — URINALYSIS, ROUTINE W REFLEX MICROSCOPIC
BILIRUBIN URINE: NEGATIVE
GLUCOSE, UA: NEGATIVE mg/dL
Hgb urine dipstick: NEGATIVE
Ketones, ur: 15 mg/dL — AB
Leukocytes, UA: NEGATIVE
NITRITE: NEGATIVE
PH: 6 (ref 5.0–8.0)
Protein, ur: NEGATIVE mg/dL
SPECIFIC GRAVITY, URINE: 1.01 (ref 1.005–1.030)

## 2015-08-22 LAB — RAPID URINE DRUG SCREEN, HOSP PERFORMED
AMPHETAMINES: NOT DETECTED
BENZODIAZEPINES: POSITIVE — AB
Barbiturates: NOT DETECTED
COCAINE: NOT DETECTED
OPIATES: NOT DETECTED
Tetrahydrocannabinol: POSITIVE — AB

## 2015-08-22 LAB — GLUCOSE, CAPILLARY: Glucose-Capillary: 76 mg/dL (ref 65–99)

## 2015-08-22 LAB — POCT PREGNANCY, URINE: Preg Test, Ur: NEGATIVE

## 2015-08-22 MED ORDER — PAROXETINE HCL 20 MG PO TABS: 20.0000 mg | ORAL_TABLET | Freq: Every day | ORAL | Status: DC

## 2015-08-22 MED ORDER — HYDROXYZINE HCL 25 MG PO TABS: 25.0000 mg | ORAL_TABLET | Freq: Four times a day (QID) | ORAL | Status: DC | PRN

## 2015-08-22 MED ORDER — KETOROLAC TROMETHAMINE 60 MG/2ML IM SOLN
60.0000 mg | Freq: Once | INTRAMUSCULAR | Status: AC
  Administered 2015-08-22: 60 mg via INTRAMUSCULAR
  Filled 2015-08-22: qty 2

## 2015-08-22 MED ORDER — DEXTROSE 5 % IN LACTATED RINGERS IV BOLUS
1000.0000 mL | Freq: Once | INTRAVENOUS | Status: AC
  Administered 2015-08-22: 1000 mL via INTRAVENOUS

## 2015-08-22 MED ORDER — ONDANSETRON 4 MG PO TBDP
4.0000 mg | ORAL_TABLET | Freq: Once | ORAL | Status: AC
  Administered 2015-08-22: 4 mg via ORAL
  Filled 2015-08-22: qty 1

## 2015-08-22 MED ORDER — LACTATED RINGERS IV BOLUS (SEPSIS): 1000.0000 mL | Freq: Once | INTRAVENOUS | Status: DC

## 2015-08-22 MED ORDER — HYDROXYZINE HCL 50 MG/ML IM SOLN
50.0000 mg | Freq: Four times a day (QID) | INTRAMUSCULAR | Status: DC | PRN
  Administered 2015-08-22: 50 mg via INTRAMUSCULAR
  Filled 2015-08-22 (×2): qty 1

## 2015-08-22 MED ORDER — KETOROLAC TROMETHAMINE 10 MG PO TABS: 10.0000 mg | ORAL_TABLET | Freq: Four times a day (QID) | ORAL | Status: DC | PRN

## 2015-08-22 NOTE — MAU Note (Signed)
Interview over

## 2015-08-22 NOTE — MAU Note (Signed)
Pt to be discharged given Rx for her anxiety and depression meds and torodol for pain. She was upset wanted percocet. And to talk to provider again. She reinforced the plan for the pt to f/u in the clinic and with the list of couselors she was given. Pgt stated she did not have the money to pick up her meds and left.
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
## 2015-08-22 NOTE — BH Assessment (Signed)
Assessment completed. Consulted Maryjean Mornharles Kober, PA-C who recommended that pt follow up outpatient resources. Pt does not meet inpatient criteria at this time. Pt denies SI, HI and AVH. Informed Sharen CounterLisa Leftwich-Kirby of the recommendation.

## 2015-08-22 NOTE — MAU Note (Signed)
Pt still seems anxious about her abd pain and what will be done about it. Had given her Toradol and it had helped with the pain and explained that [facility] could [facility] her endometriosis. Pt requesting to talk to Provider again. . Informed L.Leftwich-Kirby,CNm and she will talk to pt.

## 2015-08-22 NOTE — MAU Provider Note (Signed)
History     CSN: [id number]  Arrival date and time: 08/22/15 1755   First Provider Initiated Contact with Patient 08/22/15 1838      Chief Complaint  Patient presents with  . Abdominal Pain   HPI   [name] is a 24 y.o. female [id number] with a history of endometriosis diagnosed 3 years ago with laparoscopic surgery and generalized anxiety disorder, she presents to MAU with abdominal pain, along with multiple other complaints/ requests.  She states that her abdominal pain is worse than it normally is and she "cannot take the pain." She has taken Neurontin only and says it has helped some, however the medication is wearing off.  She has not tried any NSAIDS today, however says in the past they have not worked. . She has tried multiple treatments for endometriosis including birth control pills and depo, she says nothing works.   She has not been eating a normal diet in the last three days because she feels that she cannot do to her stomach. She is requesting an IV to make her feel like she ate something.   She is also concerned about her depression. After having a panic attack today at work she wanted to be seen for her symptoms. She was given a "blue pill" by her friend to take for anxiety after the panic attack. She was prescribed medication for depression last July after being involuntarily committed to [facility] for suicidal ideation, however she ran out and has not had it refilled. She denies suicidal, or homicidal ideations at this time.  She just feels anxious and worried. She voices concern about being committed again because she cannot miss work.   She does not have insurance currently, and this is why she has not been to the dr.   Sexually active; last month.  She is interested in STI testing today.   Patient is requesting refills on her Paxil, vistaril and something strong for pain. And she is asking for a prescription for something similar to Roxicet which she was  taking when she lived in [place].  OB History    Gravida Para Term Preterm AB TAB SAB Ectopic Multiple Living        Past Medical History  Diagnosis Date  . Asthma   . Endometriosis   . Seizures (HCC)   . Depression   . Anxiety     Past Surgical History  Procedure Laterality Date  . Endometrial ablation N/A 2012    Family History  Problem Relation Age of Onset  . Asthma Father   . Hypertension Father   . Diabetes Mellitus II Father     Social History  Substance Use Topics  . Smoking status: Current Every Day Smoker -- 1.50 packs/day for 9 years    Types: Cigarettes  . Smokeless tobacco: None  . Alcohol Use: Yes     Comment: social drinker only    Allergies: No Known Allergies  Prescriptions prior to admission  Medication Sig Dispense Refill Last Dose  . gabapentin (NEURONTIN) 100 MG capsule Take 2 capsules (200 mg total) by mouth 2 (two) times daily. (Patient not taking: Reported on 08/22/2015) 120 capsule 0   . hydrOXYzine (ATARAX/VISTARIL) 25 MG tablet Take 1 tablet (25 mg total) by mouth every 6 (six) hours as needed for anxiety. (Patient not taking: Reported on 08/22/2015) 30 tablet 0   . mirtazapine (REMERON) 15 MG tablet Take 1 tablet (15  mg total) by mouth at bedtime and may repeat dose one time if needed. (Patient not taking: Reported on 08/22/2015) 30 tablet 0   . nicotine (NICODERM CQ - DOSED IN MG/24 HOURS) 21 mg/24hr patch Place 1 patch (21 mg total) onto the skin daily. (Patient not taking: Reported on 08/22/2015) 28 patch 0   . PARoxetine (PAXIL) 20 MG tablet Take 1 tablet (20 mg total) by mouth every morning. (Patient not taking: Reported on 08/22/2015) 30 tablet 0    Results for orders placed or performed during the hospital encounter of 08/22/15 (from the past 48 hour(s))  Urinalysis, Routine w reflex microscopic (not at [facility])     Status: Abnormal   Collection Time: 08/22/15  6:05 PM  Result Value Ref Range   Color, Urine  YELLOW YELLOW   APPearance CLEAR CLEAR   Specific Gravity, Urine 1.010 1.005 - 1.030   pH 6.0 5.0 - 8.0   Glucose, UA NEGATIVE NEGATIVE mg/dL   Hgb urine dipstick NEGATIVE NEGATIVE   Bilirubin Urine NEGATIVE NEGATIVE   Ketones, ur 15 (A) NEGATIVE mg/dL   Protein, ur NEGATIVE NEGATIVE mg/dL   Nitrite NEGATIVE NEGATIVE   Leukocytes, UA NEGATIVE NEGATIVE    Comment: MICROSCOPIC NOT DONE ON URINES WITH NEGATIVE PROTEIN, BLOOD, LEUKOCYTES, NITRITE, OR GLUCOSE <1000 mg/dL.  Pregnancy, urine POC     Status: None   Collection Time: 08/22/15  6:15 PM  Result Value Ref Range   Preg Test, Ur NEGATIVE NEGATIVE    Comment:        THE SENSITIVITY OF THIS METHODOLOGY IS >24 mIU/mL   Glucose, capillary     Status: None   Collection Time: 08/22/15  7:29 PM  Result Value Ref Range   Glucose-Capillary 76 65 - 99 mg/dL    Review of Systems  Constitutional: Positive for chills and malaise/fatigue. Negative for fever.  Gastrointestinal: Positive for abdominal pain, diarrhea and constipation.  Musculoskeletal: Positive for back pain (Lower back that shoots up to her upper back ).  Neurological: Positive for dizziness. Negative for seizures.  Psychiatric/Behavioral: Positive for depression. Negative for suicidal ideas, hallucinations, memory loss and substance abuse. The patient is nervous/anxious and has insomnia.    Physical Exam   Temperature 98.7 F (37.1 C), temperature source Oral, resp. rate 18, height  (1.651 m), weight 146 lb (66.225 kg), last menstrual period 08/16/2015.  Physical Exam  Constitutional: She is oriented to person, place, and time. She appears well-developed. She appears distressed.  Respiratory: Effort normal.  GI: Soft. Normal appearance. There is generalized tenderness. There is no rigidity, no rebound and no guarding.  Genitourinary: Uterus is not enlarged and not tender. Cervix exhibits no motion tenderness. Right adnexum displays no mass, no tenderness and no  fullness. Left adnexum displays tenderness. Left adnexum displays no fullness.  Musculoskeletal: Normal range of motion.  Neurological: She is alert and oriented to person, place, and time.  Skin: She is not diaphoretic.  Psychiatric: Her mood appears anxious. Her speech is rapid and/or pressured. Thought content is not paranoid and not delusional. She exhibits a depressed mood (tearful ). She expresses no homicidal and no suicidal ideation. She expresses no suicidal plans and no homicidal plans.    MAU Course  Procedures  None  MDM  CBG: 76 D5LR bolus CBC and CMP two days ago were normal Urine drug screen Toradol 60 IM Zofran 4 mg PO   Vistaril 50 mg IV for anxiety.   Report given to Sharen Counter CNM  who resumes care of the patient- patient awaiting telepysch   Assessment and Plan     Duane LopeJennifer I Rasch, NP 08/22/2015 6:56 PM   MDM: I have reviewed previous provider's assessment and labs.  Telepsych conference completed and decision made to provide pt with outpatient resources, no need for inpatient treatment at this time.  When CNM entered room, pt asleep and difficult to awaken.  Pt reported improvement in pain after IM Toradol but continued to request narcotic medication, reporting she took these before, so why can't she have a medication like this to help her go back to work.  Discussed with pt plan to manage endometriosis pain with NSAIDs and extend her antidepressant and anti-anxiety medications for 30 days until she can get into psych provider.  Pt called CNM back to room after discharge plan made to discuss desire for pain medication again.  Pt reported frustration that her pain is the same at discharge as it was when she arrived.  Pt again indicated that her pain is chronic and mentioned how she has had long history of management for pain starting at age 24.  Pt does have problems paying for medications, including antidepressant medicines.  Pt sister reported that the  family could help her pay for medicines but the patient reports this is not true and that her family cannot help.  Recommended pt use community resources and printed information given to assist with finding low cost health care.  Plan for pt to follow up with [facility] to evaluate endometriosis/pelvic pain.  Pt stable at time of discharge.  A: Abdominal pain in female patient  Major depressive disorder, recurrent, severe without psychotic features (HCC)  Polysubstance abuse  P: [facility] home [facility]patient pelvic US ordered Rx renewed for Paxil 20 mg daily and Atarax/Vistaril 25 mg Q 6 hours Toradol 10 mg PO Q 6 hours PRN F/U with Psych provider, resources given  F/U with primary care provider, resources given Return to MAU as needed for Gyn emergencies  LEFTWICH-KIRBY, Britlee Skolnik, CNM 10:21 PM

## 2015-08-22 NOTE — Discharge Instructions (Signed)
Pelvic Pain, Female °Female pelvic pain can be caused by many different things and start from a variety of places. Pelvic pain refers to pain that is located in the lower half of the abdomen and between your hips. The pain may occur over a short period of time (acute) or may be reoccurring (chronic). The cause of pelvic pain may be related to disorders affecting the female reproductive organs (gynecologic), but it may also be related to the bladder, kidney stones, an intestinal complication, or muscle or skeletal problems. Getting help right away for pelvic pain is important, especially if there has been severe, sharp, or a sudden onset of unusual pain. It is also important to get help right away because some types of pelvic pain can be life threatening.  °CAUSES  °Below are only some of the causes of pelvic pain. The causes of pelvic pain can be in one of several categories.  °· Gynecologic. °¨ Pelvic inflammatory disease. °¨ Sexually transmitted infection. °¨ Ovarian cyst or a twisted ovarian ligament (ovarian torsion). °¨ Uterine lining that grows outside the uterus (endometriosis). °¨ Fibroids, cysts, or tumors. °¨ Ovulation. °· Pregnancy. °¨ Pregnancy that occurs outside the uterus (ectopic pregnancy). °¨ Miscarriage. °¨ Labor. °¨ Abruption of the placenta or ruptured uterus. °· Infection. °¨ Uterine infection (endometritis). °¨ Bladder infection. °¨ Diverticulitis. °¨ Miscarriage related to a uterine infection (septic abortion). °· Bladder. °¨ Inflammation of the bladder (cystitis). °¨ Kidney stone(s). °· Gastrointestinal. °¨ Constipation. °¨ Diverticulitis. °· Neurologic. °¨ Trauma. °¨ Feeling pelvic pain because of mental or emotional causes (psychosomatic). °· Cancers of the bowel or pelvis. °EVALUATION  °Your caregiver will want to take a careful history of your concerns. This includes recent changes in your health, a careful gynecologic history of your periods (menses), and a sexual history. Obtaining  your family history and medical history is also important. Your caregiver may suggest a pelvic exam. A pelvic exam will help identify the location and severity of the pain. It also helps in the evaluation of which organ system may be involved. In order to identify the cause of the pelvic pain and be properly treated, your caregiver may order tests. These tests may include:  °· A pregnancy test. °· Pelvic ultrasonography. °· An X-ray exam of the abdomen. °· A urinalysis or evaluation of vaginal discharge. °· Blood tests. °HOME CARE INSTRUCTIONS  °· Only take over-the-counter or prescription medicines for pain, discomfort, or fever as directed by your caregiver.   °· Rest as directed by your caregiver.   °· Eat a balanced diet.   °· Drink enough fluids to make your urine clear or pale yellow, or as directed.   °· Avoid sexual intercourse if it causes pain.   °· Apply warm or cold compresses to the lower abdomen depending on which one helps the pain.   °· Avoid stressful situations.   °· Keep a journal of your pelvic pain. Write down when it started, where the pain is located, and if there are things that seem to be associated with the pain, such as food or your menstrual cycle. °· Follow up with your caregiver as directed.   °SEEK MEDICAL CARE IF: °· Your medicine does not help your pain. °· You have abnormal vaginal discharge. °SEEK IMMEDIATE MEDICAL CARE IF:  °· You have heavy bleeding from the vagina.   °· Your pelvic pain increases.   °· You feel light-headed or faint.   °· You have chills.   °· You have pain with urination or blood in your urine.   °· You have uncontrolled   diarrhea or vomiting.   °· You have a fever or persistent symptoms for more than 3 days. °· You have a fever and your symptoms suddenly get worse.   °· You are being physically or sexually abused. °  °This information is not intended to replace advice given to you by your health care provider. Make sure you discuss any questions you have with  your health care provider. °  °Document Released: 08/16/2004 Document Revised: 06/10/2015 Document Reviewed: 01/09/2012 °Elsevier Interactive Patient Education ©2016 Elsevier Inc. ° °[place] Resource Guide °(Revised August 2014) ° ° °Chronic Pain Problems:  °• [facility]:  [phone]  °         Patients need to be referred by their primary care doctor/specialist ° °Insufficient Money for Medicine:  °·         United Way: call "211"   °• MAP Program at [facility] [place] [phone] or [place] 641-7620 °          ° °No Primary Care Doctor:  °To locate a primary care doctor that accepts your insurance or provides certain services:  °·         [facility] [facility]: 832-8000  °·         Physician Referral Service: 1-800-533-3463 ask for “My [place]” °• If no insurance, you need to see if you qualify for GCCN “orange card”, call to set      up appointment for eligibility/enrollment at [phone] or [phone] or visit [place] [facility] ([address], [place] and [address] -[place]) to meet with a GCCN enrollment specialist. ° °Agencies that provide inexpensive (sliding fee scale) medical care:  ° °•    [facility] - [facility] at [place] - [phone] °•    [facility]  -  [facility] - [phone] °•    [facility] - [phone] °•    [facility] - 336-832-4444 °•    [facility] - [phone] °•     Family Practice - 336-832-8035 °• [facility] - [facility] [place] - [phone]-     [phone] °• [facility] - [facility] [place] - 336-370-9091 °• [facility]: [phone]  °• [facility]: 336-832-4777  °• [facility]: 336-373-0678  °• [facility] - 336- 387-6161 ° ° ° °Medicaid-accepting  [place] Providers:  °·         [facility] - 641-2100 (No Family Planning accepted) °·         [address], [phone], Mon-Fri 9am-5pm °·         [facility] - [phone] °• [address], [address], Mon-Thursday 8am-5pm, Fri 8am-noon °• [facility] - [phone] °·         [address], [address], Mon-Fri 7:30am-4:30pm °·         [facility] - [phone] °·         [address] [place] [address], Mon-Fri 8am-5pm  °·        [facility] - [phone] °·         [address] °·         Only accepts [place] Access   Medicaid patients after they have their name applied to their card ° °Self Pay (no insurance) in [place]:  °         Sickle Cell Patients:  °• [address], (336) [phone] °[facility]: °• [address], [place] (336) 832-7272 ° °     [facility] °• [address], [place] (336) 832-4444 ° °[facility]: °• [address], (336) [phone] °         [facility]  °·         [address], (336) [phone] °[facility] °• [address], (336) [phone] °          [facility]  °·         [address], [place] [phone] °       [facility] - [address]  °·         [phone], Mon-Fri 9am-7pm, Sat 9am-1pm °         [facility] [place] °·         [address] [place] [address], [phone] °         [facility] [place]  °·         [address], [phone] °[facility] - [facility] [place] °• [address], [place] (336) [phone] °         [facility]  °·         [address] [place] [address], [phone] ° [facility]  °• [address],  (336) [phone] °[facility] °• [address], (336) [phone] ° Dr. Osei-Bonsu  °·         [address], [place], [phone] °         [facility]  °·         [address], [phone] °         [facility]  °·           [address], [phone] °         [facility]  °·         [address], [phone], 1st & 3rd Saturday every month, 10am-1pm ° °OTHERS:  [facility]  ([facility] Only)  ([phone]) [phone] (Thursday only) ° °Strategies for finding a Primary Care Provider:  °1) Find a Doctor and Pay Out of Pocket  °Although you won't have to find out who is covered by your insurance plan, it is a good idea to ask around and get recommendations. You will then need to call the office and see if the doctor you have chosen will accept you as a new patient and what types of options they offer for patients who are self-pay. Some doctors offer discounts or will set up payment plans for their patients who do not have insurance, but you will need to ask so you aren't surprised when you get to your appointment.  °2) Contact [facility] - To see if you qualify for “orange card” access to healthcare safety net providers.  Call for   appointment for eligibility/enrollment at [phone] or [phone]. (Uninsured, 0-200% FPL, qualifying info)  °Applicants for GCCN are first required to see if they are eligible to enroll in the ACA Marketplace before enrolling in GCCN (and get an exemption if they are not).  °GCCN Criteria for acceptance is:  °? Proof of ACA Marketing exemption - form or documentation  °? Valid photo ID (driver's license, state identification card, passport, home country ID)  °? Proof of [place] residency (e.g. driver’s license, lease/landlord information, pay stubs with address, utility bill, bank statement, etc.)  °? Proof of income (1040, last year's tax  return, W2, 4 current pay stubs, other income proof)  °? Proof of assets (current bank statement + 3 most recent, disability paperwork, life insurance info, tax value on autos, etc.) ° °3) Contact Your Local Health Department  °Not all health departments have doctors that can see patients for sick visits, but many do, so it is worth a call to see if yours does. If you don't know where your local health department is, you can check in your phone book. The CDC also has a tool to help you locate your state's health department, and many state websites also have listings of all of their local health departments.  °4) Find a Walk-in Clinic  °If your illness is not likely to be very severe or complicated, you may want to try a walk in clinic. These are popping up all over the country in pharmacies, drugstores, and shopping centers. They're usually staffed by nurse practitioners or physician assistants that have been trained to treat common illnesses and complaints. They're usually fairly quick and inexpensive. However, if you have serious medical issues or chronic medical problems, these are probably not your best option  ° °STD Testing:  °·         [facility] [place], STD Clinic  °·         [address], [place], phone [phone] or [phone]  °·         Monday - Friday, call for an appointment °·         [facility] [place], STD Clinic  °·         [address], [place], phone [phone] or [phone]  °·         Monday - Friday, call for an appointment °Abuse/Neglect:  °·         [place] Child Abuse Hotline: 641-3795  °·         [place] Child Abuse Hotline: 800-378-5315 (After Hours) ° °Emergency Shelter:  °[facility] (336) 271-5959 ° [facility] [place]- (336) 881-5415 ° [facility] [place] - (336) 235-0330 ° [facility] - (336) 375-1332 (ages 11-17) ° [facility]eless Day [facility] @ [facility] - (336) 332-0824 °  °Mammograms - Free at [facility] - [place] - 614-3233 ° °Maternity Homes:  °·         [facility]: (336) 275-9566   (Homeless mother with children) °·         [facility]: (704) 372-4663 (Mothers only) °• [facility]: (336) 370-9232 (Pregnant 16-21 years old) °• [facility] -(336) 641-7777 ° °[place] [facility]  °• [facility] °• [address], [place] (336) 355-9701 °·         [facility] [place]  °·         [address], [place]  °·         349-3220 °·         [employer]  °·         [address], [place]  °·         342-7768 °·         [place] [facility]  °·         [address], [place]  °·         342-8140 °·         [facility]  °·         342-8316 °·         [place] [facility] - [facility]  °·         1-888-581-9988 °·         [facility] in   °·         [address]  °·         336-349-4454, Insurance °·         [place] Child Abuse Hotline  °·         (336) 342-1394  °·         (336) 342-3537 (After Hours) ° °Behavioral Health Services /Substance Abuse Resources:  °·         [facility]: 882-2125  °·         [facility]: 784-9470  °·        [facility]: (336) 285-9073  °• Narcotics Helpline - 1-866-375-1272 °·         [facility]: (336) 845-3988  °·         [facility]stance Abuse [facility] - [facility]: 800-659-3381 °• [facility] - (336) 285-2605 °Psychological Services: °·         [facility]: 832-9600  °• [facility]: 1-877-626-1772 °·         [facility]  °·         [address] [place] [address], [place]  °·         ACCESS LINE: 1-800-256-2452     (24 Hour) °• Mobile Crisis:  °• HELPLINES:  °[facility] -  [place] (336) 370-4264 °[facility] - [place] (800) 451-9682 °• Walk In Crisis Services °      [facility] [address] [place] [address] - [place]  (336)676-6905 °      [facility] - (336)832-9600 or (336) 832-9700 ° [facility] - [address] - [place] (336)899-1505 ° [facility] [place] [facility] - [address], [place] (336) 878-6098 ° ° °Dental Assistance:  °If unable to pay or uninsured, contact: [place] [facility] to become qualified for the adult dental clinic. Patient must be enrolled in GCCN (uninsured, 0-200% FPL, qualifying info).  Enroll in GCCN first, then see Primary Care Physician assigned to you, the PCP makes a dental referral. [facility] will receive referral and contacts patient for appointment. ° °Patients with Medicaid  °         [address], [phone] ° [facility]Children up to 20 + Pregnant Women) - (336) 641-3152 ° [facility] - [address] - [address] (336) 235-2808 ° °If unable to pay, or uninsured: contact [place] [facility] ([phone] in [place] - (Children only + Pregnant Women), 641-7733 in [place]- Children only) to become qualified for the adult dental clinic  °Must see if eligible to enroll in ACA Health Insurance Marketplace before enrolling into the GCCN (exemption required) (1-855-733-3711 for an appointment)  www.healthcare.gov;   1-800-318-2596.  If not eligible for ACA, then go by Department of Health and Human Services to see if eligible for “orange card.”  [address], [place] and [address]- [place].  Once you get an orange card, you will have a Primary Care home who will then refer you to dental if needed. ° ° ° ° ° ° ° °Other Low-Cost Community Dental Services:  ° [facility] - 334-4822 (ext 50251) °  [address] [place] [address] ° Dr. Civils - 272-4177 °  [address] °  ° [facility] -  734-7550 °  [address] ° °         [facility]  °         [address], [place], [place], [address]  °         723-1848, Ext. 123  °         2nd and 4th Thursday of the month at 6:30am (Simple extractions only - no wisdom teeth or surgery) First come/First serve -First 10 clients served ° °         [facility] ([place] and [place] residents only) °         [address], [place], [place], [address]  °         723-7904 °          °         [facility]  °         342-8273 °         [place]  °        703-3100 °        [facility] °         570-6415 °  °Transportation Options: ° Ambulance - 911 - $250-$700 per ride °Family Member to accompany patient (if stable) - [place] [employer] - (336) 355-6499 ° PART - (336) 662-0002 ° Taxi - (336) 272-5112 - [place] ° [facility] - (336) 333-6589 (Application required) ° [place] [facility] - (336) 641-4848 °  °

## 2015-08-22 NOTE — MAU Note (Signed)
Pt talking with tele psych  counselor .

## 2015-08-22 NOTE — MAU Note (Addendum)
Pt has endometriosis.Had surgery 3 years ago. Has had pain with every period. Last 7 days or more. This last period has bleed very heavy for the past few days. Has bleed through  Tampon and pads.  Pain is very bad and can't stand it anymore. Pt stated she had and anxiety attract today at work. Suffers from anxiety and depression and has been out of her medication.

## 2015-08-23 ENCOUNTER — Encounter (HOSPITAL_COMMUNITY): Payer: Self-pay | Admitting: *Deleted

## 2015-08-24 ENCOUNTER — Encounter (HOSPITAL_COMMUNITY): Payer: Self-pay | Admitting: Obstetrics and Gynecology

## 2015-08-31 ENCOUNTER — Encounter: Payer: Self-pay | Admitting: Obstetrics and Gynecology

## 2015-09-15 ENCOUNTER — Ambulatory Visit (HOSPITAL_COMMUNITY): Admission: RE | Admit: 2015-09-15 | Payer: Self-pay | Source: Ambulatory Visit

## 2015-09-22 ENCOUNTER — Ambulatory Visit (HOSPITAL_COMMUNITY): Payer: Self-pay | Attending: Advanced Practice Midwife

## 2015-10-31 ENCOUNTER — Inpatient Hospital Stay (HOSPITAL_COMMUNITY)
Admission: AD | Admit: 2015-10-31 | Discharge: 2015-10-31 | Disposition: A | Discharge status: Home / Self Care | Payer: Medicaid Other | Source: Ambulatory Visit | Attending: Obstetrics & Gynecology | Admitting: Obstetrics & Gynecology

## 2015-10-31 ENCOUNTER — Telehealth: Payer: Self-pay | Admitting: Obstetrics and Gynecology

## 2015-10-31 ENCOUNTER — Encounter (HOSPITAL_COMMUNITY): Payer: Self-pay | Admitting: *Deleted

## 2015-10-31 DIAGNOSIS — G8929 Other chronic pain: Secondary | ICD-10-CM

## 2015-10-31 DIAGNOSIS — F17210 Nicotine dependence, cigarettes, uncomplicated: Secondary | ICD-10-CM | POA: Insufficient documentation

## 2015-10-31 DIAGNOSIS — B9689 Other specified bacterial agents as the cause of diseases classified elsewhere: Secondary | ICD-10-CM

## 2015-10-31 DIAGNOSIS — N760 Acute vaginitis: Secondary | ICD-10-CM | POA: Insufficient documentation

## 2015-10-31 DIAGNOSIS — Z765 Malingerer [conscious simulation]: Secondary | ICD-10-CM | POA: Insufficient documentation

## 2015-10-31 LAB — URINALYSIS, ROUTINE W REFLEX MICROSCOPIC
Bilirubin Urine: NEGATIVE
Glucose, UA: NEGATIVE mg/dL
Ketones, ur: NEGATIVE mg/dL
LEUKOCYTES UA: NEGATIVE
NITRITE: NEGATIVE
PH: 6 (ref 5.0–8.0)
Protein, ur: NEGATIVE mg/dL
SPECIFIC GRAVITY, URINE: 1.025 (ref 1.005–1.030)

## 2015-10-31 LAB — RAPID URINE DRUG SCREEN, HOSP PERFORMED
AMPHETAMINES: NOT DETECTED
BENZODIAZEPINES: POSITIVE — AB
Barbiturates: NOT DETECTED
COCAINE: NOT DETECTED
OPIATES: NOT DETECTED
TETRAHYDROCANNABINOL: POSITIVE — AB

## 2015-10-31 LAB — WET PREP, GENITAL
SPERM: NONE SEEN
TRICH WET PREP: NONE SEEN
Yeast Wet Prep HPF POC: NONE SEEN

## 2015-10-31 LAB — URINE MICROSCOPIC-ADD ON

## 2015-10-31 LAB — POCT PREGNANCY, URINE: Preg Test, Ur: NEGATIVE

## 2015-10-31 MED ORDER — METRONIDAZOLE 500 MG PO TABS
500.0000 mg | ORAL_TABLET | Freq: Two times a day (BID) | ORAL | Status: DC
Start: 2015-10-31 — End: 2015-11-16

## 2015-10-31 NOTE — MAU Provider Note (Signed)
History     CSN: [id number]  Arrival date and time: 10/31/15 1548   First Provider Initiated Contact with Patient 10/31/15 1619      Chief Complaint  Patient presents with  . Endometriosis  . Abdominal Pain   HPI   [name] is a 25 y.o. female G0P0000 with a history of polysubstance abuse, endometriosis, and non-compliance.  She was seen here in the last 2 months and was not able to follow through with any of the recommended treatment plan. She did not fill her RX's.   She has no primary care Dr. As of today. She is trying to get an orange card.   She presents today with a pulling pain in her belly button that radiates to the middle of her lower stomach. This pain is different than the pain she has had in the past related to her endometriosis.  Her menstrual cycle started today and the pain became worse.  She has taken Advil today for the pain. Which has helped some.   She denies narcotic use.   She was taking birth control pills for the treatment of endometriosis and her Dr. [place] told her she should not take hormones any more for endometriosis.     OB History    Gravida Para Term Preterm AB TAB SAB Ectopic Multiple Living   0 0 0 0 0 0 0 0        Past Medical History  Diagnosis Date  . Asthma   . Endometriosis   . Seizures (HCC)   . Depression   . Anxiety   . Polysubstance abuse     Past Surgical History  Procedure Laterality Date  . Endometrial ablation N/A 2012    Family History  Problem Relation Age of Onset  . Asthma Father   . Hypertension Father   . Diabetes Mellitus II Father     Social History  Substance Use Topics  . Smoking status: Light Tobacco Smoker -- 1.50 packs/day for 9 years    Types: Cigarettes  . Smokeless tobacco: None  . Alcohol Use: No     Comment: social drinker only    Allergies: No Known Allergies  Prescriptions prior to admission  Medication Sig Dispense Refill Last Dose  . gabapentin (NEURONTIN) 100  MG capsule Take 2 capsules (200 mg total) by mouth 2 (two) times daily. (Patient not taking: Reported on 08/22/2015) 120 capsule 0   . hydrOXYzine (ATARAX/VISTARIL) 25 MG tablet Take 1 tablet (25 mg total) by mouth every 6 (six) hours as needed for anxiety. 60 tablet 0   . ibuprofen (ADVIL,MOTRIN) 200 MG tablet Take 200-400 mg by mouth every 6 (six) hours as needed for mild pain, moderate pain or cramping.   unknown  . ketorolac (TORADOL) 10 MG tablet Take 1 tablet (10 mg total) by mouth every 6 (six) hours as needed. 20 tablet 0   . PARoxetine (PAXIL) 20 MG tablet Take 1 tablet (20 mg total) by mouth daily. 30 tablet 0    Results for orders placed or performed during the hospital encounter of 10/31/15 (from the past 48 hour(s))  Urinalysis, Routine w reflex microscopic (not at [facility])     Status: Abnormal   Collection Time: 10/31/15  3:59 PM  Result Value Ref Range   Color, Urine YELLOW YELLOW   APPearance CLEAR CLEAR   Specific Gravity, Urine 1.025 1.005 - 1.030   pH 6.0 5.0 - 8.0   Glucose, UA NEGATIVE NEGATIVE mg/dL  Hgb urine dipstick TRACE (A) NEGATIVE   Bilirubin Urine NEGATIVE NEGATIVE   Ketones, ur NEGATIVE NEGATIVE mg/dL   Protein, ur NEGATIVE NEGATIVE mg/dL   Nitrite NEGATIVE NEGATIVE   Leukocytes, UA NEGATIVE NEGATIVE  Urine rapid drug screen (hosp performed)     Status: Abnormal   Collection Time: 10/31/15  3:59 PM  Result Value Ref Range   Opiates NONE DETECTED NONE DETECTED   Cocaine NONE DETECTED NONE DETECTED   Benzodiazepines POSITIVE (A) NONE DETECTED   Amphetamines NONE DETECTED NONE DETECTED   Tetrahydrocannabinol POSITIVE (A) NONE DETECTED   Barbiturates NONE DETECTED NONE DETECTED    Comment:        DRUG SCREEN FOR MEDICAL PURPOSES ONLY.  IF CONFIRMATION IS NEEDED FOR ANY PURPOSE, NOTIFY LAB WITHIN 5 DAYS.        LOWEST DETECTABLE LIMITS FOR URINE DRUG SCREEN Drug Class       Cutoff (ng/mL) Amphetamine      1000 Barbiturate      200 Benzodiazepine    200 Tricyclics       300 Opiates          300 Cocaine          300 THC              50   Urine microscopic-add on     Status: Abnormal   Collection Time: 10/31/15  3:59 PM  Result Value Ref Range   Squamous Epithelial / LPF 0-5 (A) NONE SEEN   WBC, UA 0-5 0 - 5 WBC/hpf   RBC / HPF 0-5 0 - 5 RBC/hpf   Bacteria, UA FEW (A) NONE SEEN  Pregnancy, urine POC     Status: None   Collection Time: 10/31/15  4:02 PM  Result Value Ref Range   Preg Test, Ur NEGATIVE NEGATIVE    Comment:        THE SENSITIVITY OF THIS METHODOLOGY IS >24 mIU/mL   Wet prep, genital     Status: Abnormal   Collection Time: 10/31/15  4:39 PM  Result Value Ref Range   Yeast Wet Prep HPF POC NONE SEEN NONE SEEN   Trich, Wet Prep NONE SEEN NONE SEEN   Clue Cells Wet Prep HPF POC PRESENT (A) NONE SEEN   WBC, Wet Prep HPF POC MODERATE (A) NONE SEEN    Comment: MODERATE BACTERIA SEEN   Sperm NONE SEEN     Review of Systems  Constitutional: Negative for fever and chills.  Gastrointestinal: Positive for nausea, abdominal pain and diarrhea. Negative for vomiting and constipation.  Genitourinary: Negative for dysuria.   Physical Exam   Blood pressure 128/82, pulse 82, temperature 98.2 F (36.8 C), temperature source Oral, resp. rate 18, height  (1.651 m), weight 148 lb (67.132 kg), last menstrual period 10/31/2015.  Physical Exam  Constitutional: She is oriented to person, place, and time. She appears well-developed and well-nourished. No distress.  HENT:  Head: Normocephalic.  Eyes: Pupils are equal, round, and reactive to light.  Neck: Neck supple.  Respiratory: Effort normal.  GI: Soft. She exhibits no distension and no mass. There is no tenderness. There is no rebound and no guarding.  Genitourinary:  Speculum exam: Vagina - Small amount of dark red blood in the vagina, no odor Cervix - + active bleeding, small amount  Bimanual exam: Cervix closed, no CMT  Uterus non tender, normal size Adnexa  non tender, no masses bilaterally GC/Chlam, wet prep done Chaperone present for exam.  Musculoskeletal:  Normal range of motion.  Neurological: She is alert and oriented to person, place, and time.  Skin: Skin is warm. She is not diaphoretic.  Psychiatric: Her behavior is normal.    MAU Course  Procedures  None  MDM  Per Up to Date combined estrogen and progestin contraceptives are the first-line treatment for most women with endometriosis-related pain because they can be used long-term and are well tolerated.  The patient declined birth control pills today.   Patient declines recent narcotic use, although drug screen shows differently.    Assessment and Plan   A:  1. Chronic pain   2. BV (bacterial vaginosis)   3. Drug-seeking behavior     P:  Discharge home in stable condition RX: flagyl Narcotic pain medication was not given at this visit Follow up with the [facility]; patient encouraged to call Return to MAU for emergencies.   Duane Lope, NP 10/31/2015 4:22 PM

## 2015-10-31 NOTE — Discharge Instructions (Signed)

## 2015-10-31 NOTE — MAU Note (Signed)
Patient presents with pulling sensation in mid abdomen, has not been able to sleep for 2 days, has had surgery for endometriosis been diagnosed in [place], has chronic pain worse today.

## 2015-11-02 ENCOUNTER — Telehealth: Payer: Self-pay

## 2015-11-02 NOTE — Telephone Encounter (Signed)
Pt called and asked if she needed to still come to the appt she had scheduled for 11/16/15 @ 1445.  Called pt and pt asked if she still needed her [place].  I advised pt that yes, it important that goes to [place] appt before her scheduled appt with provider because it would make the appointment more meaningful.  Pt stated understanding and that she will [place] to re-schedule her appt.

## 2015-11-11 ENCOUNTER — Telehealth: Payer: Self-pay | Admitting: General Practice

## 2015-11-11 ENCOUNTER — Ambulatory Visit (HOSPITAL_COMMUNITY)
Admission: RE | Admit: 2015-11-11 | Discharge: 2015-11-11 | Disposition: A | Discharge status: Home / Self Care | Payer: Medicaid Other | Source: Ambulatory Visit | Attending: Advanced Practice Midwife | Admitting: Advanced Practice Midwife

## 2015-11-11 DIAGNOSIS — G8929 Other chronic pain: Secondary | ICD-10-CM | POA: Insufficient documentation

## 2015-11-11 DIAGNOSIS — R1084 Generalized abdominal pain: Secondary | ICD-10-CM | POA: Insufficient documentation

## 2015-11-11 DIAGNOSIS — R109 Unspecified abdominal pain: Secondary | ICD-10-CM

## 2015-11-11 NOTE — Telephone Encounter (Signed)
Called patient and informed her of normal ultrasound results. Patient verbalized understanding & asked what we would do at her appt next week. Told patient she will meet with the doctor who will review the ultrasound and her history with her. Patient verbalized understanding & had no other questions

## 2015-11-16 ENCOUNTER — Ambulatory Visit (INDEPENDENT_AMBULATORY_CARE_PROVIDER_SITE_OTHER): Payer: Self-pay | Admitting: Obstetrics & Gynecology

## 2015-11-16 ENCOUNTER — Encounter: Payer: Self-pay | Admitting: Obstetrics & Gynecology

## 2015-11-16 VITALS — BP 125/91 | HR 74 | Temp 92.2°F | Wt 141.9 lb

## 2015-11-16 DIAGNOSIS — Z113 Encounter for screening for infections with a predominantly sexual mode of transmission: Secondary | ICD-10-CM

## 2015-11-16 DIAGNOSIS — A499 Bacterial infection, unspecified: Secondary | ICD-10-CM

## 2015-11-16 DIAGNOSIS — K582 Mixed irritable bowel syndrome: Secondary | ICD-10-CM

## 2015-11-16 DIAGNOSIS — N760 Acute vaginitis: Secondary | ICD-10-CM

## 2015-11-16 DIAGNOSIS — N898 Other specified noninflammatory disorders of vagina: Secondary | ICD-10-CM

## 2015-11-16 DIAGNOSIS — B9689 Other specified bacterial agents as the cause of diseases classified elsewhere: Secondary | ICD-10-CM

## 2015-11-16 MED ORDER — METRONIDAZOLE 500 MG PO TABS: 500.0000 mg | ORAL_TABLET | Freq: Two times a day (BID) | ORAL | Status: DC

## 2015-11-16 NOTE — Patient Instructions (Addendum)
[facility] [address] [place], [place] [address] [phone]  Vaginitis Vaginitis is an inflammation of the vagina. It is most often caused by a change in the normal balance of the bacteria and yeast that live in the vagina. This change in balance causes an overgrowth of certain bacteria or yeast, which causes the inflammation. There are different types of vaginitis, but the most common types are:  Bacterial vaginosis.  Yeast infection (candidiasis).  Trichomoniasis vaginitis. This is a sexually transmitted infection (STI).  Viral vaginitis.  Atrophic vaginitis.  Allergic vaginitis. CAUSES  The cause depends on the type of vaginitis. Vaginitis can be caused by:  Bacteria (bacterial vaginosis).  Yeast (yeast infection).  A parasite (trichomoniasis vaginitis)  A virus (viral vaginitis).  Low hormone levels (atrophic vaginitis). Low hormone levels can occur during pregnancy, breastfeeding, or after menopause.  Irritants, such as bubble baths, scented tampons, and feminine sprays (allergic vaginitis). Other factors can change the normal balance of the yeast and bacteria that live in the vagina. These include:  Antibiotic medicines.  Poor hygiene.  Diaphragms, vaginal sponges, spermicides, birth control pills, and intrauterine devices (IUD).  Sexual intercourse.  Infection.  Uncontrolled diabetes.  A weakened immune system. SYMPTOMS  Symptoms can vary depending on the cause of the vaginitis. Common symptoms include:  Abnormal vaginal discharge.  The discharge is white, gray, or yellow with bacterial vaginosis.  The discharge is thick, white, and cheesy with a yeast infection.  The discharge is frothy and yellow or greenish with trichomoniasis.  A bad vaginal odor.  The odor is fishy with bacterial vaginosis.  Vaginal itching, pain, or swelling.  Painful intercourse.  Pain or burning when urinating. Sometimes,  there are no symptoms. TREATMENT  Treatment will vary depending on the type of infection.   Bacterial vaginosis and trichomoniasis are often treated with antibiotic creams or pills.  Yeast infections are often treated with antifungal medicines, such as vaginal creams or suppositories.  Viral vaginitis has no cure, but symptoms can be treated with medicines that relieve discomfort. Your sexual partner should be treated as well.  Atrophic vaginitis may be treated with an estrogen cream, pill, suppository, or vaginal ring. If vaginal dryness occurs, lubricants and moisturizing creams may help. You may be told to avoid scented soaps, sprays, or douches.  Allergic vaginitis treatment involves quitting the use of the product that is causing the problem. Vaginal creams can be used to treat the symptoms. HOME CARE INSTRUCTIONS   Take all medicines as directed by your caregiver.  Keep your genital area clean and dry. Avoid soap and only rinse the area with water.  Avoid douching. It can remove the healthy bacteria in the vagina.  Do not use tampons or have sexual intercourse until your vaginitis has been treated. Use sanitary pads while you have vaginitis.  Wipe from front to back. This avoids the spread of bacteria from the rectum to the vagina.  Let air reach your genital area.  Wear cotton underwear to decrease moisture buildup.  Avoid wearing underwear while you sleep until your vaginitis is gone.  Avoid tight pants and underwear or nylons without a cotton panel.  Take off wet clothing (especially bathing suits) as soon as possible.  Use mild, non-scented products. Avoid using irritants, such as:  Scented feminine sprays.  Fabric softeners.  Scented detergents.  Scented tampons.  Scented soaps or bubble baths.  Practice safe sex and use condoms. Condoms may prevent the spread  of trichomoniasis and viral vaginitis. SEEK MEDICAL CARE IF:   You have abdominal pain.  You  have a fever or persistent symptoms for more than 2-3 days.  You have a fever and your symptoms suddenly get worse.   This information is not intended to replace advice given to you by your health care provider. Make sure you discuss any questions you have with your health care provider.   Document Released: 07/17/2007 Document Revised: 02/03/2015 Document Reviewed: 03/01/2012 Elsevier Interactive Patient Education 2016 Elsevier Inc.       Diet for Irritable Bowel Syndrome When you have irritable bowel syndrome (IBS), the foods you eat and your eating habits are very important. IBS may cause various symptoms, such as abdominal pain, constipation, or diarrhea. Choosing the right foods can help ease discomfort caused by these symptoms. Work with your health care provider and dietitian to find the best eating plan to help control your symptoms. WHAT GENERAL GUIDELINES DO I NEED TO FOLLOW?  Keep a food diary. This will help you identify foods that cause symptoms. Write down:  What you eat and when.  What symptoms you have.  When symptoms occur in relation to your meals.  Avoid foods that cause symptoms. Talk with your dietitian about other ways to get the same nutrients that are in these foods.  Eat more foods that contain fiber. Take a fiber supplement if directed by your dietitian.  Eat your meals slowly, in a relaxed setting.  Aim to eat 5-6 small meals per day. Do not skip meals.  Drink enough fluids to keep your urine clear or pale yellow.  Ask your health care provider if you should take an over-the-counter probiotic during flare-ups to help restore healthy gut bacteria.  If you have cramping or diarrhea, try making your meals low in fat and high in carbohydrates. Examples of carbohydrates are pasta, rice, whole grain breads and cereals, fruits, and vegetables.  If dairy products cause your symptoms to flare up, try eating less of them. You might be able to handle yogurt  better than other dairy products because it contains bacteria that help with digestion. WHAT FOODS ARE NOT RECOMMENDED? The following are some foods and drinks that may worsen your symptoms:  Fatty foods, such as [place] fries.  Milk products, such as cheese or ice cream.  Chocolate.  Alcohol.  Products with caffeine, such as coffee.  Carbonated drinks, such as soda. The items listed above may not be a complete list of foods and beverages to avoid. Contact your dietitian for more information. WHAT FOODS ARE GOOD SOURCES OF FIBER? Your health care provider or dietitian may recommend that you eat more foods that contain fiber. Fiber can help reduce constipation and other IBS symptoms. Add foods with fiber to your diet a little at a time so that your body can get used to them. Too much fiber at once might cause gas and swelling of your abdomen. The following are some foods that are good sources of fiber:  Apples.  Peaches.  Pears.  Berries.  Figs.  Broccoli (raw).  Cabbage.  Carrots.  Raw peas.  Kidney beans.  Lima beans.  Whole grain bread.  Whole grain cereal. FOR MORE INFORMATION  International Foundation for Functional Gastrointestinal Disorders: www.iffgd.[employer] of Diabetes and Digestive and Kidney Diseases: http://norris-lawson.com/.aspx   This information is not intended to replace advice given to you by your health care provider. Make sure you discuss any questions you have with your health  care provider.  Document Released: 12/10/2003 Document Revised: 10/10/2014 Document Reviewed: 12/20/2013 Elsevier Interactive Patient Education 2016 Elsevier Inc.     Irritable Bowel Syndrome, Adult Irritable bowel syndrome (IBS) is not one specific disease. It is a group of symptoms that affects the organs responsible for digestion (gastrointestinal or GI tract).  To regulate how your GI tract  works, your body sends signals back and forth between your intestines and your brain. If you have IBS, there may be a problem with these signals. As a result, your GI tract does not function normally. Your intestines may become more sensitive and overreact to certain things. This is especially true when you eat certain foods or when you are under stress.  There are four types of IBS. These may be determined based on the consistency of your stool:   IBS with diarrhea.   IBS with constipation.   Mixed IBS.   Unsubtyped IBS.  It is important to know which type of IBS you have. Some treatments are more likely to be helpful for certain types of IBS.  CAUSES  The exact cause of IBS is not known. RISK FACTORS You may have a higher risk of IBS if:  You are a woman.  You are younger than 24 years old.  You have a family history of IBS.  You have mental health problems.  You have had bacterial infection of your GI tract. SIGNS AND SYMPTOMS  Symptoms of IBS vary from person to person. The main symptom is abdominal pain or discomfort. Additional symptoms usually include one or more of the following:   Diarrhea, constipation, or both.   Abdominal swelling or bloating.   Feeling full or sick after eating a small or regular-size meal.   Frequent gas.   Mucus in the stool.   A feeling of having more stool left after a bowel movement.  Symptoms tend to come and go. They may be associated with stress, psychiatric conditions, or nothing at all.  DIAGNOSIS  There is no specific test to diagnose IBS. Your health care provider will make a diagnosis based on a physical exam, medical history, and your symptoms. You may have other tests to rule out other conditions that may be causing your symptoms. These may include:   Blood tests.   X-rays.   CT scan.  Endoscopy and colonoscopy. This is a test in which your GI tract is viewed with a long, thin, flexible tube. TREATMENT There  is no cure for IBS, but treatment can help relieve symptoms. IBS treatment often includes:   Changes to your diet, such as:  Eating more fiber.  Avoiding foods that cause symptoms.  Drinking more water.  Eating regular, medium-sized portioned meals.  Medicines. These may include:  Fiber supplements if you have constipation.  Medicine to control diarrhea (antidiarrheal medicines).  Medicine to help control muscle spasms in your GI tract (antispasmodic medicines).  Medicines to help with any mental health issues, such as antidepressants or tranquilizers.  Therapy.  Talk therapy may help with anxiety, depression, or other mental health issues that can make IBS symptoms worse.  Stress reduction.  Managing your stress can help keep symptoms under control. HOME CARE INSTRUCTIONS   Take medicines only as directed by your health care provider.  Eat a healthy diet.  Avoid foods and drinks with added sugar.  Include more whole grains, fruits, and vegetables gradually into your diet. This may be especially helpful if you have IBS with constipation.  Avoid any  foods and drinks that make your symptoms worse. These may include dairy products and caffeinated or carbonated drinks.  Do not eat large meals.  Drink enough fluid to keep your urine clear or pale yellow.  Exercise regularly. Ask your health care provider for recommendations of good activities for you.  Keep all follow-up visits as directed by your health care provider. This is important. SEEK MEDICAL CARE IF:   You have constant pain.  You have trouble or pain with swallowing.  You have worsening diarrhea. SEEK IMMEDIATE MEDICAL CARE IF:   You have severe and worsening abdominal pain.   You have diarrhea and:   You have a rash, stiff neck, or severe headache.   You are irritable, sleepy, or difficult to awaken.   You are weak, dizzy, or extremely thirsty.   You have bright red blood in your stool or  you have black tarry stools.   You have unusual abdominal swelling that is painful.   You vomit continuously.   You vomit blood (hematemesis).   You have both abdominal pain and a fever.    This information is not intended to replace advice given to you by your health care provider. Make sure you discuss any questions you have with your health care provider.   Document Released: 09/19/2005 Document Revised: 10/10/2014 Document Reviewed: 06/06/2014 Elsevier Interactive Patient Education [employer]

## 2015-11-16 NOTE — Progress Notes (Signed)
CLINIC ENCOUNTER NOTE  History:  25 y.o. G0P0000 here today for evaluation of continued intermittent abdominal pain, associated with alternating bouts of constipation and diarrhea.  Has had this for several years, but it is getting worse.  Pain is centered around umbilical area, "pulling' sensations occur and this is different from endometriosis pain.  No fevers, chills, sweats, blood in stool.  Also reports having continued vaginal irritation, was currently treated for  BV.  She denies any abnormal vaginal bleeding, pelvic pain or other concerns.   Past Medical History  Diagnosis Date  . Asthma   . Endometriosis   . Seizures (HCC)   . Depression   . Anxiety   . Polysubstance abuse     Past Surgical History  Procedure Laterality Date  . Diagnostic laparoscopy  2012    In [place], endometriosis diagnosed and lesions removed    The following portions of the patient's history were reviewed and updated as appropriate: allergies, current medications, past family history, past medical history, past social history, past surgical history and problem list.   Health Maintenance:  Normal pap several years ago.  Review of Systems:  Pertinent items noted in HPI and remainder of comprehensive ROS otherwise negative.  Objective:  Physical Exam BP 125/91 mmHg  Pulse 74  Temp(Src) 92.2 F (33.4 C)  Wt 141 lb 14.4 oz (64.365 kg)  LMP 10/31/2015 CONSTITUTIONAL: Well-developed, well-nourished female in no acute distress.  HENT:  Normocephalic, atraumatic. External right and left ear normal. Oropharynx is clear and moist EYES: Conjunctivae and EOM are normal. Pupils are equal, round, and reactive to light. No scleral icterus.  NECK: Normal range of motion, supple, no masses SKIN: Skin is warm and dry. No rash noted. Not diaphoretic. No erythema. No pallor. NEUROLOGIC: Alert and oriented to person, place, and time. Normal reflexes, muscle tone coordination. No cranial nerve deficit  noted. PSYCHIATRIC: Normal mood and affect. Normal behavior. Normal judgment and thought content. CARDIOVASCULAR: Normal heart rate noted RESPIRATORY: Effort and breath sounds normal, no problems with respiration noted ABDOMEN: Soft, no distention noted, no tenderness elicited   PELVIC: Normal appearing external genitalia; normal appearing vaginal mucosa and cervix.  Copious, yellow, malodorous discharge noted and pelvic cultures obtained.  Normal uterine size, no other palpable masses, no uterine or adnexal tenderness. MUSCULOSKELETAL: Normal range of motion. No edema noted.  Labs and Imaging US Transvaginal Non-ob  11/11/2015  CLINICAL DATA:  Chronic intermittent lower pelvic pain for the past 5 years. History of endometriosis and with endometrial ablation. Pelvic ultrasound report of December 09, 2011 EXAM: TRANSABDOMINAL AND TRANSVAGINAL ULTRASOUND OF PELVIS TECHNIQUE: Both transabdominal and transvaginal ultrasound examinations of the pelvis were performed. Transabdominal technique was performed for global imaging of the pelvis including uterus, ovaries, adnexal regions, and pelvic cul-de-sac. It was necessary to proceed with endovaginal exam following the transabdominal exam to visualize the endometrium and ovaries. COMPARISON:  Pelvic ultrasound report dated December 09, 2011 FINDINGS: Uterus Measurements: 8.1 x 2.7 x 4.6 cm. No fibroids or other mass visualized. Endometrium Thickness: 4.3 mm. No endometrial fluid collections or masses are observed. Right ovary Measurements: 4.7 x 2.5 x 3.9 cm. Normal appearance/no adnexal mass. Left ovary Measurements: 4.4 x 2.2 x 4.0 cm. Normal appearance/no adnexal mass. Other findings No abnormal free fluid. IMPRESSION: 1. Normal appearance of the uterus and endometrium. 2. Normal appearance of the ovaries. 3. There are no adnexal masses nor is there free pelvic fluid. Electronically Signed   By: David  [place]az[place]and M.D.  On: 11/11/2015 15:53   US Pelvis  Complete  11/11/2015  CLINICAL DATA:  Chronic intermittent lower pelvic pain for the past 5 years. History of endometriosis and with endometrial ablation. Pelvic ultrasound report of December 09, 2011 EXAM: TRANSABDOMINAL AND TRANSVAGINAL ULTRASOUND OF PELVIS TECHNIQUE: Both transabdominal and transvaginal ultrasound examinations of the pelvis were performed. Transabdominal technique was performed for global imaging of the pelvis including uterus, ovaries, adnexal regions, and pelvic cul-de-sac. It was necessary to proceed with endovaginal exam following the transabdominal exam to visualize the endometrium and ovaries. COMPARISON:  Pelvic ultrasound report dated December 09, 2011 FINDINGS: Uterus Measurements: 8.1 x 2.7 x 4.6 cm. No fibroids or other mass visualized. Endometrium Thickness: 4.3 mm. No endometrial fluid collections or masses are observed. Right ovary Measurements: 4.7 x 2.5 x 3.9 cm. Normal appearance/no adnexal mass. Left ovary Measurements: 4.4 x 2.2 x 4.0 cm. Normal appearance/no adnexal mass. Other findings No abnormal free fluid. IMPRESSION: 1. Normal appearance of the uterus and endometrium. 2. Normal appearance of the ovaries. 3. There are no adnexal masses nor is there free pelvic fluid. Electronically Signed   By: David  [place] M.D.   On: 11/11/2015 15:53    Assessment & Plan:  1. Irritable bowel syndrome with both constipation and diarrhea Symptoms consistent with IBS, especially given her history of endometriosis Cannot afford to see GI specialist, will go to [facility] - Ambulatory referral to Internal Medicine  2. Vaginal irritation Likely BV.  Will follow up results and manage accordingly. - Wet prep, genital - GC/Chlamydia probe amp ([place])not at [facility]  2. BV (bacterial vaginosis) Refill ordered - metroNIDAZOLE (FLAGYL) 500 MG tablet; Take 1 tablet (500 mg total) by mouth 2 (two) times daily.  Dispense: 14 tablet; Refill: 2 Proper vulvar hygiene  emphasized: discussed avoidance of perfumed soaps, detergents, lotions and any type of douches; in addition to wearing cotton underwear and no underwear at night.  Also recommended cleaning front to back, voiding and cleaning up after intercourse.   Routine preventative health maintenance measures emphasized, informed about free cervical cancer screening clinics. Please refer to After Visit Summary for other counseling recommendations.    Jaynie Collins, MD, FACOG Attending Obstetrician & Gynecologist, [facility]

## 2015-11-17 LAB — WET PREP, GENITAL
TRICH WET PREP: NONE SEEN
WBC, Wet Prep HPF POC: NONE SEEN
YEAST WET PREP: NONE SEEN

## 2015-11-17 LAB — GC/CHLAMYDIA PROBE AMP (~~LOC~~) NOT AT ARMC
CHLAMYDIA, DNA PROBE: NEGATIVE
NEISSERIA GONORRHEA: NEGATIVE

## 2015-11-18 ENCOUNTER — Telehealth: Payer: Self-pay | Admitting: *Deleted

## 2015-11-18 NOTE — Telephone Encounter (Signed)
Per Dr Macon Large, attempted to call patient to let her know that her gc/clam test was normal. SHe should be taking flagyl for bv, hopefully symptoms resolving, especially if she followed Dr Mont Dutton advice regarding genital hygiene (per her note). A message stated that the person was unavailable to receive calls at this time. Will try to call later.

## 2015-11-18 NOTE — Telephone Encounter (Signed)
-----   Message from Tereso Newcomer, MD sent at 11/17/2015 11:10 AM EST ----- BV is already treated with Metronidazole. Please call to reassure patient that nothing else was found, GC/Chlam tests are pending but she will be notified of any concerning results.

## 2015-11-20 ENCOUNTER — Encounter: Payer: Self-pay | Admitting: *Deleted

## 2015-11-20 NOTE — Telephone Encounter (Signed)
Attempted to call patient again. Patient unavailable to take calls. Letter sent.

## 2015-11-27 LAB — GC/CHLAMYDIA PROBE AMP (~~LOC~~) NOT AT ARMC
Chlamydia: NEGATIVE
Neisseria Gonorrhea: NEGATIVE

## 2017-05-04 NOTE — Telephone Encounter (Signed)
See note

## 2018-10-03 NOTE — L&D Delivery Note (Signed)
OB/GYN Faculty Practice Delivery Note  Caroline Gaines is a 28 y.o. G1P0000 s/p VD at [redacted]w[redacted]d. She was admitted for IOL for GDMA2.   ROM: 6h 32m with clear fluid GBS Status: --Henderson Cloud (11/23 1119) Maximum Maternal Temperature: 98.55F  Labor Progress: . Initial SVE: 1/thick/-3. Patient received Cytotec, Foley bulb, AROM and Pitocin. Received epidural. She then progressed to complete.   Delivery Date/Time: 12/16 @ 0450 Delivery: Called to room and patient was complete and pushing. Head delivered in LOA position. No nuchal cord present. Shoulder and body delivered in usual fashion. Infant with spontaneous cry, placed on mother's abdomen, dried and stimulated. Cord clamped x 2 after 1-minute delay, and cut by FOB. Cord blood drawn. Placenta delivered spontaneously with gentle cord traction. Fundus firm with massage and Pitocin. Labia, perineum, vagina, and cervix inspected inspected with bilateral labial and 1st degree vaginal lacerations which were repaired with 3-0 Vicryl in a standard fashion. All counts correct.  Baby Weight: pending  Placenta: Sent to L&D Complications: None Lacerations: bilateral labial and 1st degree vaginal EBL: 427 mL Analgesia: Epidural   Infant: APGAR (1 MIN): 8   APGAR (5 MINS): 9   APGAR (10 MINS):     Barrington Ellison, MD OB Family Medicine Fellow, [facility] 09/18/2019, 5:19 AM

## 2019-03-15 ENCOUNTER — Telehealth: Payer: Self-pay | Admitting: Family Medicine

## 2019-03-15 ENCOUNTER — Telehealth (INDEPENDENT_AMBULATORY_CARE_PROVIDER_SITE_OTHER): Payer: Self-pay | Admitting: Medical

## 2019-03-15 DIAGNOSIS — B379 Candidiasis, unspecified: Secondary | ICD-10-CM

## 2019-03-15 NOTE — Telephone Encounter (Signed)
Returned pt's call and pt states she feels that she has a yeast infection.  Pt states she has vaginal itching and irritation and a white discharge that is thick.  Discussed with Dr. Ilda Basset who recommended pt come in to the office to be seen as she has not been seen in 3 years and is reporting that she is [redacted] weeks pregnant.  Appointment scheduled for Monday 6/15 for self swab.  Pt verbalized understanding.

## 2019-03-15 NOTE — Telephone Encounter (Signed)
The patient has complaints of stomach cramping and stated she feels she may have a yeast infection. She took some over the counter medication however its not working. She also stated she hasn't had any prenatal care and would like to speak to someone to make sure she and the baby are ok.    I've scheduled an appointment for a self a swab.

## 2019-03-15 NOTE — Telephone Encounter (Signed)
Attempted to call patient about her appointment on 6/15 @ 10:40. No answer, number just rang for several minutes before I hung up. Patient was not screened or informed about office procedures.

## 2019-03-15 NOTE — Addendum Note (Signed)
Addended by: Dolores Hoose on: 03/15/2019 11:06 AM   Modules accepted: Orders

## 2019-03-17 ENCOUNTER — Ambulatory Visit (HOSPITAL_COMMUNITY)
Admission: EM | Admit: 2019-03-17 | Discharge: 2019-03-17 | Disposition: A | Discharge status: Home / Self Care | Payer: Medicaid Other | Attending: Emergency Medicine | Admitting: Emergency Medicine

## 2019-03-17 ENCOUNTER — Encounter (HOSPITAL_COMMUNITY): Payer: Self-pay | Admitting: Emergency Medicine

## 2019-03-17 ENCOUNTER — Other Ambulatory Visit: Payer: Self-pay

## 2019-03-17 DIAGNOSIS — R110 Nausea: Secondary | ICD-10-CM | POA: Diagnosis present

## 2019-03-17 DIAGNOSIS — Z3201 Encounter for pregnancy test, result positive: Secondary | ICD-10-CM | POA: Diagnosis not present

## 2019-03-17 DIAGNOSIS — R109 Unspecified abdominal pain: Secondary | ICD-10-CM | POA: Insufficient documentation

## 2019-03-17 LAB — POCT URINALYSIS DIP (DEVICE)
Bilirubin Urine: NEGATIVE
Glucose, UA: 100 mg/dL — AB
Hgb urine dipstick: NEGATIVE
Ketones, ur: NEGATIVE mg/dL
Nitrite: NEGATIVE
Protein, ur: NEGATIVE mg/dL
Specific Gravity, Urine: 1.025 (ref 1.005–1.030)
Urobilinogen, UA: 0.2 mg/dL (ref 0.0–1.0)
pH: 7 (ref 5.0–8.0)

## 2019-03-17 LAB — POCT PREGNANCY, URINE: Preg Test, Ur: POSITIVE — AB

## 2019-03-17 MED ORDER — DOXYLAMINE-PYRIDOXINE 10-10 MG PO TBEC: 2.0000 | DELAYED_RELEASE_TABLET | Freq: Every day | ORAL | 0 refills | Status: DC

## 2019-03-17 NOTE — ED Triage Notes (Signed)
Per pt she is about 13 to [redacted] weeks pregnant and has been having vomiting and abdominal cramping. Pt said no blood. Pt said she had this happen before and had Zofran but out.

## 2019-03-17 NOTE — Discharge Instructions (Addendum)
Diclegis: Two tablets at bedtime on day 1 and 2; if symptoms persist, take 1 tablet in morning and 2 tablets at bedtime on day 3; if symptoms persist, may increase to 1 tablet in morning, 1 tablet mid-afternoon, and 2 tablets at bedtime on day 4 (maximum: doxylamine 40 mg/pyridoxine 40 mg (4 tablets) per day). OR One-half of the 25 mg Unisom sleep tablet over-the-counter tablet or two chewable 5 mg tablets can be used off-label as an antiemetic. In addition, pyridoxine 25 mg, also available over-the-counter, is taken three or four times per day;This is a reasonable, less expensive substitute for combination tablets.  Be sure to drink plenty of fluids Start prenatal vitamin if you have not already Follow up with OBGYN as planned to establish care  We will send the swab off to check for gonorrhea, chlamydia, trichomonas, bacterial vaginosis, and yeast; we will call with these results early next week  If you nausea/vomiting is not improving with the medicines prescribed, still unable to tolerate fluids, or developing increased abdominal pain, please follow up in the emergency room/women/s hospital

## 2019-03-17 NOTE — ED Provider Notes (Signed)
[facility]    CSN: [id number] Arrival date & time: 03/17/19  1024      History   Chief Complaint Chief Complaint  Patient presents with  . Nausea  . Emesis    HPI Caroline Gaines is a 28 y.o. female history of asthma, tobacco use, presenting today for evaluation of nausea, vomiting and abdominal cramping.  Patient states that over the past week she has had worsening nausea and vomiting.  She has had difficulty tolerating liquids.  She is approximately [redacted] weeks pregnant.  She has not establish care with OB/GYN yet.  She has a follow-up appointment tomorrow.  This is her first pregnancy.  Notes that family history of prematurity and gestational diabetes.  She notes early in pregnancy she tested positive for trichomonas and was treated.  She has not been rechecked to ensure resolution of this.  She is also been treated for bacterial vaginosis.  She started to have repeat of the symptoms in the past week and started using the metronidazole vaginal cream again yesterday.  She has had some intermittent mild cramping which feels like menstrual cramps.  She denies the pain right now.  States it is off and on.  She is also had some diarrhea.  Over the past couple days she has had increased dizziness described as room spinning.  Does not relate this is being positional.  Denies sensation of presyncope.  HPI  Past Medical History:  Diagnosis Date  . Anxiety   . Asthma   . Depression   . Endometriosis   . Polysubstance abuse (HCC)   . Seizures [facility](HCC)     Patient Active Problem List   Diagnosis Date Noted  . Major depressive disorder, recurrent, severe without psychotic features (HCC)   . Major depressive disorder, recurrent episode, severe (HCC) 04/18/2015  . Generalized anxiety disorder 04/18/2015  . Polysubstance abuse (HCC) 04/18/2015  . SMOKER 11/02/2009  . ASTHMA 11/02/2009  . HEADACHE, CHRONIC 11/02/2009    Past Surgical History:  Procedure Laterality Date  .  DIAGNOSTIC LAPAROSCOPY  2012   In [place], endometriosis diagnosed and lesions removed    OB History    Gravida  1   Para  0   Term  0   Preterm  0   AB  0   Living        SAB  0   TAB  0   Ectopic  0   Multiple      Live Births               Home Medications    Prior to Admission medications   Medication Sig Start Date End Date Taking? Authorizing Provider  acetaminophen (TYLENOL) 500 MG tablet Take 1,000 mg by mouth every 6 (six) hours as needed for headache. Reported on 11/16/2015    [provider]  Doxylamine-Pyridoxine 10-10 MG TBEC Take 2 tablets by mouth at bedtime. 03/17/19   Arien Benincasa C, PA-C  ibuprofen (ADVIL,MOTRIN) 200 MG tablet Take 200-400 mg by mouth every 6 (six) hours as needed for mild pain or cramping. Reported on 11/16/2015    [provider]    Family History Family History  Problem Relation Age of Onset  . Asthma Father   . Hypertension Father   . Diabetes Mellitus II Father     Social History Social History   Tobacco Use  . Smoking status: Light Tobacco Smoker    Packs/day: 1.50    Years: 9.00  Pack years: 13.50    Types: Cigarettes  . Smokeless tobacco: Never Used  Substance Use Topics  . Alcohol use: No    Comment: social drinker only  . Drug use: Yes    Types: Cocaine, Marijuana, Oxycodone, Benzodiazepines, Heroin, Hydrocodone    Comment: daily use     Allergies   Patient has no known allergies.   Review of Systems Review of Systems  Constitutional: Negative for fever.  Respiratory: Negative for shortness of breath.   Cardiovascular: Negative for chest pain.  Gastrointestinal: Positive for abdominal pain, diarrhea, nausea and vomiting.  Genitourinary: Positive for vaginal discharge. Negative for dysuria, flank pain, genital sores, hematuria, menstrual problem, vaginal bleeding and vaginal pain.  Musculoskeletal: Negative for back pain.  Skin: Negative for rash.  Neurological:  Positive for dizziness. Negative for light-headedness and headaches.     Physical Exam Triage Vital Signs ED Triage Vitals [03/17/19 1048]  Enc Vitals Group     BP 102/60     Pulse Rate 79     Resp 16     Temp 98.3 F (36.8 C)     Temp Source Oral     SpO2 100 %     Weight      Height      Head Circumference      Peak Flow      Pain Score 2     Pain Loc      Pain Edu?      Excl. in GC?    No data found.  Updated Vital Signs BP 102/60 (BP Location: Right Arm)   Pulse 79   Temp 98.3 F (36.8 C) (Oral)   Resp 16   SpO2 100%   Visual Acuity Right Eye Distance:   Left Eye Distance:   Bilateral Distance:    Right Eye Near:   Left Eye Near:    Bilateral Near:     Physical Exam Vitals signs and nursing note reviewed.  Constitutional:      General: She is not in acute distress.    Appearance: She is well-developed.  HENT:     Head: Normocephalic and atraumatic.     Mouth/Throat:     Comments: Oral mucosa pink and moist, no tonsillar enlargement or exudate. Posterior pharynx patent and nonerythematous, no uvula deviation or swelling. Normal phonation. Eyes:     Extraocular Movements: Extraocular movements intact.     Conjunctiva/sclera: Conjunctivae normal.     Pupils: Pupils are equal, round, and reactive to light.  Neck:     Musculoskeletal: Neck supple.  Cardiovascular:     Rate and Rhythm: Normal rate and regular rhythm.     Heart sounds: No murmur.  Pulmonary:     Effort: Pulmonary effort is normal. No respiratory distress.     Breath sounds: Normal breath sounds.     Comments: Breathing comfortably at rest, CTABL, no wheezing, rales or other adventitious sounds auscultated Abdominal:     Palpations: Abdomen is soft.     Tenderness: There is abdominal tenderness.     Comments: Abdomen soft, nondistended, diffuse tenderness throughout abdomen, no focal tenderness, negative rebound, negative Rovsing, negative McBurney's, negative Murphy's  Skin:     General: Skin is warm and dry.  Neurological:     General: No focal deficit present.     Mental Status: She is alert and oriented to person, place, and time. Mental status is at baseline.      UC Treatments / Results  Labs (all labs  ordered are listed, but only abnormal results are displayed) Labs Reviewed  POCT PREGNANCY, URINE - Abnormal; Notable for the following components:      Result Value   Preg Test, Ur POSITIVE (*)    All other components within normal limits  POCT URINALYSIS DIP (DEVICE) - Abnormal; Notable for the following components:   Glucose, UA 100 (*)    Leukocytes,Ua TRACE (*)    All other components within normal limits  POC URINE PREG, ED  CERVICOVAGINAL ANCILLARY ONLY    EKG None  Radiology No results found.  Procedures Procedures (including critical care time)  Medications Ordered in UC Medications - No data to display  Initial Impression / Assessment and Plan / UC Course  I have reviewed the triage vital signs and the nursing notes.  Pertinent labs & imaging results that were available during my care of the patient were reviewed by me and considered in my medical decision making (see chart for details).     Pregnancy test positive.  Nausea and vomiting likely related to this versus viral.  No focal abdominal tenderness.  Will treat with likely chest.  Patient has not had first initial prenatal visit or any imaging to confirm location of pregnancy.  Discussed concern of cramping and ectopic.  Given pain has not been progressive and has been intermittent and mild, more likely related to persistent nausea and vomiting rather than ectopic, but advised if her pain returns or worsens to follow-up in emergency room/women's Hospital to rule out ectopic.  Patient also has follow-up with OB/GYN tomorrow. Also advised if she does not have improvement of nausea and vomiting with likely just and is unable to tolerate liquids to follow-up.  Vital signs stable  today.  Also checking vaginal swab to send off to confirm resolution of trichomonas as well as check for yeast and BV.  Follow-up with OB/GYN tomorrow as planned.  Discussed strict return precautions. Patient verbalized understanding and is agreeable with plan.  Final Clinical Impressions(s) / UC Diagnoses   Final diagnoses:  Nausea  Abdominal cramping     Discharge Instructions     Diclegis: Two tablets at bedtime on day 1 and 2; if symptoms persist, take 1 tablet in morning and 2 tablets at bedtime on day 3; if symptoms persist, may increase to 1 tablet in morning, 1 tablet mid-afternoon, and 2 tablets at bedtime on day 4 (maximum: doxylamine 40 mg/pyridoxine 40 mg (4 tablets) per day). OR One-half of the 25 mg Unisom sleep tablet over-the-counter tablet or two chewable 5 mg tablets can be used off-label as an antiemetic. In addition, pyridoxine 25 mg, also available over-the-counter, is taken three or four times per day;This is a reasonable, less expensive substitute for combination tablets.  Be sure to drink plenty of fluids Start prenatal vitamin if you have not already Follow up with OBGYN as planned to establish care  We will send the swab off to check for gonorrhea, chlamydia, trichomonas, bacterial vaginosis, and yeast; we will call with these results early next week  If you nausea/vomiting is not improving with the medicines prescribed, still unable to tolerate fluids, or developing increased abdominal pain, please follow up in the emergency room/women/s hospital    ED Prescriptions    Medication Sig Dispense Auth. Provider   Doxylamine-Pyridoxine 10-10 MG TBEC Take 2 tablets by mouth at bedtime. 60 tablet Belynda Pagaduan, [place] C, PA-C     Controlled Substance Prescriptions [place] Controlled Substance Registry consulted? Not Applicable   Joneen Caraway,  Kaithlyn Teagle C, PA-C 03/17/19 1209

## 2019-03-18 ENCOUNTER — Encounter: Payer: Self-pay | Admitting: Family Medicine

## 2019-03-18 ENCOUNTER — Ambulatory Visit (INDEPENDENT_AMBULATORY_CARE_PROVIDER_SITE_OTHER): Payer: Medicaid Other | Admitting: General Practice

## 2019-03-18 ENCOUNTER — Other Ambulatory Visit: Payer: Self-pay

## 2019-03-18 DIAGNOSIS — O219 Vomiting of pregnancy, unspecified: Secondary | ICD-10-CM

## 2019-03-18 LAB — CERVICOVAGINAL ANCILLARY ONLY
Bacterial vaginitis: POSITIVE — AB
Candida vaginitis: NEGATIVE
Chlamydia: NEGATIVE
Neisseria Gonorrhea: NEGATIVE
Trichomonas: POSITIVE — AB

## 2019-03-18 MED ORDER — PROMETHAZINE HCL 25 MG PO TABS: 25.0000 mg | ORAL_TABLET | Freq: Four times a day (QID) | ORAL | 0 refills | Status: DC | PRN

## 2019-03-18 MED ORDER — PRENATAL PLUS 27-1 MG PO TABS: 1.0000 | ORAL_TABLET | Freq: Every day | ORAL | 11 refills | Status: DC

## 2019-03-18 NOTE — Progress Notes (Signed)
Patient came into office today for follow up following Urgent Care visit last night. Patient did have a wet prep performed there. Patient has not started care yet this pregnancy, states she had an appointment in [place] early in her pregnancy but all they did was ask a bunch of questions- no blood work or ultrasound. Reports LMP 12/18/18 EDD 09/24/19 [redacted]w[redacted]d. Patient reports history of endometriosis and irregular periods. FHR obtained today, 155bpm- location was right above her pubic bone. Patient reports dizziness and nausea/vomiting- states the Rx they gave her last night (Diclegis) is too expensive. Phenergan sent to pharmacy per protocol & informed patient. Also discussed following BRAT diet when she is unable to tolerate foods. Patient verbalized understanding & reports lower back pain. Recommended extra strength tylenol, heating pad on low heat on back only, & trying gentle stretches. Patient verbalized understanding and asked for PNV Rx. Rx sent in per protocol. Patient will return for new OB intake & OB appt.   Koren Bound RN BSN 03/18/19

## 2019-03-18 NOTE — Progress Notes (Signed)
Agree with A & P. 

## 2019-03-20 ENCOUNTER — Telehealth (HOSPITAL_COMMUNITY): Payer: Self-pay | Admitting: Emergency Medicine

## 2019-03-20 MED ORDER — METRONIDAZOLE 500 MG PO TABS: 500.0000 mg | ORAL_TABLET | Freq: Two times a day (BID) | ORAL | 0 refills | Status: DC

## 2019-03-20 NOTE — Telephone Encounter (Signed)
Bacterial vaginosis is positive. This was not treated at the urgent care visit.  Flagyl 500 mg BID x 7 days #14 no refills sent to patients pharmacy of choice.    Trichomonas is positive. Rx  for Flagyl 2 grams, once was sent to the pharmacy of record. Pt needs education to refrain from sexual intercourse for 7 days to give the medicine time to work. Sexual partners need to be notified and tested/treated. Condoms may reduce risk of reinfection. Recheck for further evaluation if symptoms are not improving.   Attempted to reach patient. No answer at this time. Voicemail left.   Spoke with [facility] about dosing due to pregnancy. Per hallie, okay to send 500 BID x7 days.

## 2019-03-21 ENCOUNTER — Telehealth: Payer: Self-pay | Admitting: General Practice

## 2019-03-21 DIAGNOSIS — A599 Trichomoniasis, unspecified: Secondary | ICD-10-CM

## 2019-03-21 DIAGNOSIS — O219 Vomiting of pregnancy, unspecified: Secondary | ICD-10-CM

## 2019-03-21 MED ORDER — METRONIDAZOLE 500 MG PO TABS: 2000.0000 mg | ORAL_TABLET | Freq: Once | ORAL | 1 refills | Status: AC

## 2019-03-21 MED ORDER — ONDANSETRON 4 MG PO TBDP: 4.0000 mg | ORAL_TABLET | Freq: Three times a day (TID) | ORAL | 0 refills | Status: DC | PRN

## 2019-03-21 NOTE — Telephone Encounter (Signed)
Patient called and left message on nurse voicemail line stating she has questions about her prescription. Called patient and she states the phenergan isn't helping her nausea, it's making her extremely tired and causing weird dreams. Per Dr Nehemiah Settle, may give zofran 4mg  #30. Patient informed and discussed she may use colace as needed for constipation. Patient verbalized understanding and asked if her results came back. Informed patient of BV and + trichomonas. Patient states she is already taking metrogel for the BV. Patient reports being treated last month for trichomonas and doesn't understand why she is still positive. Asked patient if her partner was treated and she states no because he tested negative. Informed patient of Rx sent to pharmacy with refill for partner treatment. Told patient her partner needs treatment whether or not his test was negative. Discussed partner will need to be aware of any allergies as we do not know that information. Patient verbalized understanding & had no questions.

## 2019-03-25 ENCOUNTER — Other Ambulatory Visit: Payer: Self-pay

## 2019-03-25 ENCOUNTER — Ambulatory Visit (INDEPENDENT_AMBULATORY_CARE_PROVIDER_SITE_OTHER): Payer: Medicaid Other

## 2019-03-25 ENCOUNTER — Ambulatory Visit: Payer: Self-pay

## 2019-03-25 DIAGNOSIS — Z3490 Encounter for supervision of normal pregnancy, unspecified, unspecified trimester: Secondary | ICD-10-CM

## 2019-03-25 DIAGNOSIS — O0990 Supervision of high risk pregnancy, unspecified, unspecified trimester: Secondary | ICD-10-CM | POA: Insufficient documentation

## 2019-03-25 MED ORDER — AMBULATORY NON FORMULARY MEDICATION: 1.0000 | 0 refills | Status: DC
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
## 2019-03-25 NOTE — Progress Notes (Signed)
Patient ID: Caroline Gaines, female   DOB: 03/09/1991, 28 y.o.   MRN: [id number] I have reviewed the chart and agree with nursing staff's documentation of this patient's encounter.  Emeterio Reeve, MD 03/25/2019 10:02 PM

## 2019-03-28 ENCOUNTER — Telehealth: Payer: Self-pay | Admitting: *Deleted

## 2019-03-28 NOTE — Telephone Encounter (Signed)
Received a voicemail from [place] this am that she is calling about a question she has about a symptom she is having.   I called her back and left a message I was returning her call.  If you are still having a question you may call us back or send a message via MyChart which may be faster.

## 2019-03-29 ENCOUNTER — Telehealth (INDEPENDENT_AMBULATORY_CARE_PROVIDER_SITE_OTHER): Payer: Medicaid Other | Admitting: Lactation Services

## 2019-03-29 DIAGNOSIS — Z3492 Encounter for supervision of normal pregnancy, unspecified, second trimester: Secondary | ICD-10-CM
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
## 2019-04-03 ENCOUNTER — Telehealth: Payer: Self-pay

## 2019-04-03 NOTE — Telephone Encounter (Signed)
Pt called stating that she had questions about sx's that she is having.  LM for pt that I am returning her call and if she continues to have questions, comments, or concerns to please give the office a call.

## 2019-04-04 ENCOUNTER — Emergency Department (HOSPITAL_COMMUNITY)
Admission: EM | Admit: 2019-04-04 | Discharge: 2019-04-04 | Discharge status: Absent without leave | Payer: Medicaid Other

## 2019-04-04 ENCOUNTER — Ambulatory Visit (HOSPITAL_COMMUNITY)
Admission: EM | Admit: 2019-04-04 | Discharge: 2019-04-04 | Disposition: A | Discharge status: Home / Self Care | Payer: Medicaid Other

## 2019-04-04 ENCOUNTER — Inpatient Hospital Stay (HOSPITAL_COMMUNITY)
Admission: AD | Admit: 2019-04-04 | Discharge: 2019-04-04 | Discharge status: Absent without leave | Payer: Medicaid Other | Attending: Obstetrics & Gynecology | Admitting: Obstetrics & Gynecology

## 2019-04-04 ENCOUNTER — Encounter (HOSPITAL_COMMUNITY): Payer: Self-pay | Admitting: Advanced Practice Midwife

## 2019-04-04 ENCOUNTER — Telehealth: Payer: Self-pay | Admitting: Obstetrics and Gynecology

## 2019-04-04 ENCOUNTER — Inpatient Hospital Stay (HOSPITAL_COMMUNITY)
Admission: AD | Admit: 2019-04-04 | Discharge: 2019-04-04 | Disposition: A | Discharge status: Home / Self Care | Payer: Medicaid Other | Attending: Obstetrics and Gynecology | Admitting: Obstetrics and Gynecology

## 2019-04-04 ENCOUNTER — Other Ambulatory Visit: Payer: Self-pay

## 2019-04-04 DIAGNOSIS — O26892 Other specified pregnancy related conditions, second trimester: Secondary | ICD-10-CM | POA: Diagnosis present

## 2019-04-04 DIAGNOSIS — O23592 Infection of other part of genital tract in pregnancy, second trimester: Secondary | ICD-10-CM

## 2019-04-04 DIAGNOSIS — Z3492 Encounter for supervision of normal pregnancy, unspecified, second trimester: Secondary | ICD-10-CM

## 2019-04-04 DIAGNOSIS — O9A212 Injury, poisoning and certain other consequences of external causes complicating pregnancy, second trimester: Secondary | ICD-10-CM | POA: Diagnosis not present

## 2019-04-04 DIAGNOSIS — O219 Vomiting of pregnancy, unspecified: Secondary | ICD-10-CM | POA: Diagnosis not present

## 2019-04-04 DIAGNOSIS — A5901 Trichomonal vulvovaginitis: Secondary | ICD-10-CM

## 2019-04-04 DIAGNOSIS — O210 Mild hyperemesis gravidarum: Secondary | ICD-10-CM | POA: Diagnosis not present

## 2019-04-04 DIAGNOSIS — Z3A15 15 weeks gestation of pregnancy: Secondary | ICD-10-CM

## 2019-04-04 DIAGNOSIS — Z87891 Personal history of nicotine dependence: Secondary | ICD-10-CM | POA: Insufficient documentation

## 2019-04-04 DIAGNOSIS — O98312 Other infections with a predominantly sexual mode of transmission complicating pregnancy, second trimester: Secondary | ICD-10-CM | POA: Diagnosis not present

## 2019-04-04 MED ORDER — ONDANSETRON 4 MG PO TBDP: 4.0000 mg | ORAL_TABLET | Freq: Three times a day (TID) | ORAL | 2 refills | Status: DC | PRN

## 2019-04-04 MED ORDER — PROMETHAZINE HCL 25 MG PO TABS: 25.0000 mg | ORAL_TABLET | Freq: Four times a day (QID) | ORAL | 2 refills | Status: DC | PRN

## 2019-04-04 MED ORDER — ONDANSETRON 4 MG PO TBDP
8.0000 mg | ORAL_TABLET | Freq: Once | ORAL | Status: AC
  Administered 2019-04-04: 8 mg via ORAL
  Filled 2019-04-04: qty 2

## 2019-04-04 MED ORDER — METRONIDAZOLE 500 MG PO TABS
2000.0000 mg | ORAL_TABLET | Freq: Once | ORAL | Status: AC
  Administered 2019-04-04: 2000 mg via ORAL
  Filled 2019-04-04 (×2): qty 4
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
## 2019-04-04 NOTE — Telephone Encounter (Signed)
Called the patient to pre-screen. Left a detailed voicemail informing if the patient is experiencing any flu-like symptoms such as fever, chills, cough, or shortness of breath and/or has been in contact with anyone who is suspected of/or confirmed to have COVID19 please call back to reschedule the appointment. Upon entering the office please wear a face mask, sanitize hands, and no visitors or children due to COVID19 restrictions. °
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
## 2019-04-08 ENCOUNTER — Encounter: Payer: Self-pay | Admitting: Obstetrics & Gynecology

## 2019-04-08 ENCOUNTER — Encounter: Payer: Self-pay | Admitting: Family Medicine

## 2019-04-08 ENCOUNTER — Other Ambulatory Visit (HOSPITAL_COMMUNITY)
Admission: RE | Admit: 2019-04-08 | Discharge: 2019-04-08 | Disposition: A | Discharge status: Home / Self Care | Payer: Medicaid Other | Source: Ambulatory Visit | Attending: Obstetrics & Gynecology | Admitting: Obstetrics & Gynecology

## 2019-04-08 ENCOUNTER — Other Ambulatory Visit: Payer: Self-pay

## 2019-04-08 ENCOUNTER — Ambulatory Visit (INDEPENDENT_AMBULATORY_CARE_PROVIDER_SITE_OTHER): Payer: Medicaid Other | Admitting: Obstetrics & Gynecology

## 2019-04-08 DIAGNOSIS — Z3492 Encounter for supervision of normal pregnancy, unspecified, second trimester: Secondary | ICD-10-CM

## 2019-04-08 DIAGNOSIS — Z3A15 15 weeks gestation of pregnancy: Secondary | ICD-10-CM | POA: Diagnosis not present

## 2019-04-08 NOTE — Progress Notes (Signed)
  Subjective:    Caroline Gaines is a G1P0000 [redacted]w[redacted]d being seen today for her first obstetrical visit.  Her obstetrical history is significant for first pregnancy. Patient is not sure if she intends to breast feed. Pregnancy history fully reviewed.  Patient reports no complaints.  Vitals:   04/08/19 1408  BP: 104/71  Pulse: 71  Weight: 176 lb 4.8 oz (80 kg)    HISTORY: OB History  Gravida Para Term Preterm AB Living  1 0 0 0 0    SAB TAB Ectopic Multiple Live Births  0 0 0        # Outcome Date GA Lbr Len/2nd Weight Sex Delivery Anes PTL Lv  1 Current            Past Medical History:  Diagnosis Date  . Anxiety   . Asthma   . Depression   . Endometriosis   . Polysubstance abuse ([place])   . Seizures ([place])    Past Surgical History:  Procedure Laterality Date  . DIAGNOSTIC LAPAROSCOPY  2012   In [place], endometriosis diagnosed and lesions removed   Family History  Problem Relation Age of Onset  . Asthma Father   . Hypertension Father   . Diabetes Mellitus II Father   . Migraines Mother      Exam    Uterus:     Pelvic Exam:    Perineum: No Hemorrhoids   Vulva: normal   Vagina:  normal mucosa   pH:    Cervix: no lesions   Adnexa: normal adnexa   Bony Pelvis: average  System: Breast:  normal appearance, no masses or tenderness   Skin: normal coloration and turgor, no rashes    Neurologic: oriented   Extremities: normal strength, tone, and muscle mass   HEENT PERRLA   Mouth/Teeth mucous membranes moist, pharynx normal without lesions   Neck supple   Cardiovascular: regular rate and rhythm, no murmurs or gallops   Respiratory:  appears well, vitals normal, no respiratory distress, acyanotic, normal RR, neck free of mass or lymphadenopathy, chest clear, no wheezing, crepitations, rhonchi, normal symmetric air entry   Abdomen: soft, non-tender; bowel sounds normal; no masses,  no organomegaly   Urinary: urethral meatus normal      Assessment:    Pregnancy: G1P0000 Patient Active Problem List   Diagnosis Date Noted  . Supervision of low-risk pregnancy 03/25/2019  . Major depressive disorder, recurrent, severe without psychotic features ([place])   . Major depressive disorder, recurrent episode, severe ([place]) 04/18/2015  . Generalized anxiety disorder 04/18/2015  . Polysubstance abuse ([place]) 04/18/2015  . SMOKER 11/02/2009  . ASTHMA 11/02/2009  . HEADACHE, CHRONIC 11/02/2009        Plan:     Initial labs drawn. Prenatal vitamins. Problem list reviewed and updated. Genetic Screening discussed Panorama: ordered.  Ultrasound discussed; fetal survey: ordered.  Follow up in 4 weeks. 50% of 30 min visit spent on counseling and coordination of care.  AFP only   Emeterio Reeve 04/08/2019

## 2019-04-08 NOTE — Patient Instructions (Signed)

## 2019-04-09 ENCOUNTER — Encounter: Payer: Self-pay | Admitting: *Deleted

## 2019-04-10 LAB — CYTOLOGY - PAP
Chlamydia: NEGATIVE
Diagnosis: NEGATIVE
Neisseria Gonorrhea: NEGATIVE
Trichomonas: POSITIVE — AB

## 2019-04-11 ENCOUNTER — Telehealth: Payer: Self-pay

## 2019-04-11 LAB — AFP, SERUM, OPEN SPINA BIFIDA
AFP MoM: 2.18
AFP Value: 60.5 ng/mL
Gest. Age on Collection Date: 15.6 weeks
Maternal Age At EDD: 27.9 yr
OSBR Risk 1 IN: 546
Test Results:: NEGATIVE
Weight: 176 [lb_av]

## 2019-04-11 NOTE — Telephone Encounter (Signed)
Pt called stating that she some tablets however the sx's are back.

## 2019-04-15 ENCOUNTER — Encounter: Payer: Self-pay | Admitting: *Deleted

## 2019-04-15 DIAGNOSIS — A599 Trichomoniasis, unspecified: Secondary | ICD-10-CM

## 2019-04-15 DIAGNOSIS — Z3492 Encounter for supervision of normal pregnancy, unspecified, second trimester: Secondary | ICD-10-CM

## 2019-04-15 MED ORDER — METRONIDAZOLE 500 MG PO TABS: 2000.0000 mg | ORAL_TABLET | Freq: Once | ORAL | 0 refills | Status: AC

## 2019-04-18 ENCOUNTER — Encounter: Payer: Self-pay | Admitting: *Deleted

## 2019-04-19 ENCOUNTER — Other Ambulatory Visit: Payer: Self-pay | Admitting: Obstetrics & Gynecology

## 2019-04-19 DIAGNOSIS — A599 Trichomoniasis, unspecified: Secondary | ICD-10-CM

## 2019-04-19 MED ORDER — METRONIDAZOLE 500 MG PO TABS: ORAL_TABLET | ORAL | 0 refills | Status: DC

## 2019-04-19 NOTE — Progress Notes (Signed)
Flagyl for trichomonas

## 2019-04-24 ENCOUNTER — Telehealth: Payer: Self-pay

## 2019-04-24 ENCOUNTER — Other Ambulatory Visit: Payer: Self-pay

## 2019-04-24 DIAGNOSIS — Z229 Carrier of infectious disease, unspecified: Secondary | ICD-10-CM

## 2019-04-24 NOTE — Telephone Encounter (Signed)
Called pt to make aware of [place] Screening Results and to also give her her Genetic Counseling date & time, no answer, left VM of appt on 05/03/19 @ 10am. Will send My Chart message reg test results.

## 2019-04-25 ENCOUNTER — Telehealth: Payer: Self-pay | Admitting: General Practice

## 2019-04-25 DIAGNOSIS — R05 Cough: Secondary | ICD-10-CM

## 2019-04-25 DIAGNOSIS — R059 Cough, unspecified: Secondary | ICD-10-CM

## 2019-04-25 DIAGNOSIS — R0981 Nasal congestion: Secondary | ICD-10-CM

## 2019-04-25 NOTE — Telephone Encounter (Signed)
Called patient regarding mychart message. Patient states she felt like she had a dry mouth and runny rose on 7/6 at her appointment and everything was attributed to allergies. Patient states in the last week she has gotten worse and has had to call out of work. Patient reports congestion in the morning, nighttime and anytime she lays down over the past week. Patient also reports coughing in the mornings with vomiting although sometimes has vomiting if she eats something the baby doesn't agree with. Reports her dry mouth/throat has gotten worse to the point where it feels hard to breathe sometimes through it especially in the morning. Patient also reports worsening headaches over the past week- previous blood pressures have been normal. States it almost feels like a cold but she knows it isn't. Patient sounds quite congested over the phone and she states she wasn't feeling well so she laid down. Discussed with Noni Saupe who advises COVID testing for the patient as symptoms are not typical of pregnancy/allergies. Informed patient and provided directions/instructions for testing. Patient verbalized understanding & had no questions

## 2019-04-29 ENCOUNTER — Ambulatory Visit (HOSPITAL_COMMUNITY): Payer: Medicaid Other | Attending: Obstetrics and Gynecology

## 2019-05-03 ENCOUNTER — Ambulatory Visit (HOSPITAL_COMMUNITY): Payer: Self-pay | Admitting: Obstetrics and Gynecology

## 2019-05-03 ENCOUNTER — Other Ambulatory Visit: Payer: Self-pay

## 2019-05-03 ENCOUNTER — Telehealth: Payer: Self-pay | Admitting: Obstetrics & Gynecology

## 2019-05-03 ENCOUNTER — Ambulatory Visit (HOSPITAL_COMMUNITY): Payer: Medicaid Other | Attending: Obstetrics and Gynecology

## 2019-05-03 NOTE — Telephone Encounter (Signed)
Called the patient to inform of the upcoming virtual visit. Informed the patient of logging in 15 minutes early and if the provider has not logged in within 15 mintues of the appointment time please call our office. Informed the patient of how to log into the mychart and access the appointment. The patient verbalized understanding.

## 2019-05-03 NOTE — Progress Notes (Signed)
Pt in telegenetics with Karyn ([facility]). 

## 2019-05-06 ENCOUNTER — Other Ambulatory Visit: Payer: Self-pay

## 2019-05-06 ENCOUNTER — Encounter: Payer: Self-pay | Admitting: Advanced Practice Midwife

## 2019-05-06 ENCOUNTER — Telehealth (INDEPENDENT_AMBULATORY_CARE_PROVIDER_SITE_OTHER): Payer: Medicaid Other | Admitting: Advanced Practice Midwife

## 2019-05-06 VITALS — BP 120/81 | HR 87

## 2019-05-06 DIAGNOSIS — Z3A19 19 weeks gestation of pregnancy: Secondary | ICD-10-CM

## 2019-05-06 DIAGNOSIS — Z3492 Encounter for supervision of normal pregnancy, unspecified, second trimester: Secondary | ICD-10-CM | POA: Diagnosis not present

## 2019-05-06 NOTE — Progress Notes (Signed)
   TELEHEALTH VIRTUAL OBSTETRICS VISIT ENCOUNTER NOTE  I connected with Caroline Gaines on 05/06/19 at  8:55 AM EDT by telephone at home and verified that I am speaking with the correct person using two identifiers.   I discussed the limitations, risks, security and privacy concerns of performing an evaluation and management service by telephone and the availability of in person appointments. I also discussed with the patient that there may be a patient responsible charge related to this service. The patient expressed understanding and agreed to proceed.  Subjective:  Caroline Gaines is a 28 y.o. G1P0000 at [redacted]w[redacted]d being followed for ongoing prenatal care.  She is currently monitored for the following issues for this low-risk pregnancy and has SMOKER; ASTHMA; HEADACHE, CHRONIC; Major depressive disorder, recurrent episode, severe ([place]); Generalized anxiety disorder; Polysubstance abuse ([place]); Major depressive disorder, recurrent, severe without psychotic features ([place]); and Supervision of low-risk pregnancy on their problem list.  Patient reports no complaints. Reports fetal movement. Denies any contractions, bleeding or leaking of fluid.   The following portions of the patient's history were reviewed and updated as appropriate: allergies, current medications, past family history, past medical history, past social history, past surgical history and problem list.   Objective:   General:  Alert, oriented and cooperative.   Mental Status: Normal mood and affect perceived. Normal judgment and thought content.  Rest of physical exam deferred due to type of encounter  Assessment and Plan:  Pregnancy: G1P0000 at [redacted]w[redacted]d 1. Encounter for supervision of low-risk pregnancy in second trimester - Routine care, OB labs not done will add to next blood draw  - CBC; Future - HIV Antibody (routine testing w rflx); Future - RPR; Future - Glucose Tolerance, 2 Hours w/1 Hour; Future - Obstetric Panel,  Including HIV; Future  Preterm labor symptoms and general obstetric precautions including but not limited to vaginal bleeding, contractions, leaking of fluid and fetal movement were reviewed in detail with the patient.  I discussed the assessment and treatment plan with the patient. The patient was provided an opportunity to ask questions and all were answered. The patient agreed with the plan and demonstrated an understanding of the instructions. The patient was advised to call back or seek an in-person office evaluation/go to MAU at [facility] for any urgent or concerning symptoms. Please refer to After Visit Summary for other counseling recommendations.     I provided 15 minutes of non-face-to-face time during this encounter.  Return in about 8 weeks (around 07/01/2019) for In person visit with 28 week labs and Glucose Tolerance Test.  Future Appointments  Date Time Provider [place]  05/10/2019  1:30 PM WH-MFC [place] 1 WH-MFCUS MFC-US   Marcille Buffy DNP, CNM  05/06/19  8:56 AM  [facility]

## 2019-05-08 ENCOUNTER — Encounter: Payer: Self-pay | Admitting: *Deleted

## 2019-05-10 ENCOUNTER — Other Ambulatory Visit: Payer: Self-pay | Admitting: Obstetrics & Gynecology

## 2019-05-10 ENCOUNTER — Other Ambulatory Visit (HOSPITAL_COMMUNITY): Payer: Self-pay | Admitting: *Deleted

## 2019-05-10 ENCOUNTER — Ambulatory Visit (HOSPITAL_COMMUNITY)
Admission: RE | Admit: 2019-05-10 | Discharge: 2019-05-10 | Disposition: A | Discharge status: Home / Self Care | Payer: Medicaid Other | Source: Ambulatory Visit | Attending: Obstetrics & Gynecology | Admitting: Obstetrics & Gynecology

## 2019-05-10 ENCOUNTER — Other Ambulatory Visit: Payer: Self-pay

## 2019-05-10 DIAGNOSIS — Z3A20 20 weeks gestation of pregnancy: Secondary | ICD-10-CM

## 2019-05-10 DIAGNOSIS — Z3490 Encounter for supervision of normal pregnancy, unspecified, unspecified trimester: Secondary | ICD-10-CM | POA: Diagnosis present

## 2019-05-10 DIAGNOSIS — Z148 Genetic carrier of other disease: Secondary | ICD-10-CM

## 2019-05-10 DIAGNOSIS — Z363 Encounter for antenatal screening for malformations: Secondary | ICD-10-CM | POA: Diagnosis not present

## 2019-05-10 DIAGNOSIS — Z362 Encounter for other antenatal screening follow-up: Secondary | ICD-10-CM

## 2019-06-03 ENCOUNTER — Encounter: Payer: Self-pay | Admitting: *Deleted

## 2019-06-04 ENCOUNTER — Ambulatory Visit (INDEPENDENT_AMBULATORY_CARE_PROVIDER_SITE_OTHER): Payer: Medicaid Other | Admitting: Women's Health

## 2019-06-04 ENCOUNTER — Encounter: Payer: Self-pay | Admitting: Family Medicine

## 2019-06-04 ENCOUNTER — Encounter: Payer: Self-pay | Admitting: Women's Health

## 2019-06-04 ENCOUNTER — Other Ambulatory Visit (HOSPITAL_COMMUNITY)
Admission: RE | Admit: 2019-06-04 | Discharge: 2019-06-04 | Disposition: A | Discharge status: Home / Self Care | Payer: Medicaid Other | Source: Ambulatory Visit | Attending: Women's Health | Admitting: Women's Health

## 2019-06-04 ENCOUNTER — Other Ambulatory Visit: Payer: Self-pay

## 2019-06-04 VITALS — BP 137/75 | HR 99 | Wt 182.0 lb

## 2019-06-04 DIAGNOSIS — Z832 Family history of diseases of the blood and blood-forming organs and certain disorders involving the immune mechanism: Secondary | ICD-10-CM | POA: Insufficient documentation

## 2019-06-04 DIAGNOSIS — R0683 Snoring: Secondary | ICD-10-CM

## 2019-06-04 DIAGNOSIS — A599 Trichomoniasis, unspecified: Secondary | ICD-10-CM

## 2019-06-04 DIAGNOSIS — D563 Thalassemia minor: Secondary | ICD-10-CM

## 2019-06-04 DIAGNOSIS — Z3492 Encounter for supervision of normal pregnancy, unspecified, second trimester: Secondary | ICD-10-CM

## 2019-06-04 DIAGNOSIS — B75 Trichinellosis: Secondary | ICD-10-CM | POA: Insufficient documentation

## 2019-06-04 DIAGNOSIS — Z3009 Encounter for other general counseling and advice on contraception: Secondary | ICD-10-CM

## 2019-06-04 DIAGNOSIS — Z3A24 24 weeks gestation of pregnancy: Secondary | ICD-10-CM

## 2019-06-04 DIAGNOSIS — Z229 Carrier of infectious disease, unspecified: Secondary | ICD-10-CM

## 2019-06-04 HISTORY — DX: Thalassemia minor: D56.3

## 2019-06-04 HISTORY — DX: Snoring: R06.83

## 2019-06-04 MED ORDER — COMFORT FIT MATERNITY SUPP SM MISC: 1.0000 [IU] | Freq: Every day | 0 refills | Status: DC | PRN

## 2019-06-04 NOTE — Progress Notes (Signed)
8:21a- Called pt for My Chart visit, no answer, left VM that I will retry in 10 minutes.   Pt is being switched to another Provider for face to face.Marland Kitchen

## 2019-06-04 NOTE — Patient Instructions (Addendum)
Preterm Labor and Birth Information ° °The normal length of a pregnancy is 39-41 weeks. Preterm labor is when labor starts before 37 completed weeks of pregnancy. °What are the risk factors for preterm labor? °Preterm labor is more likely to occur in women who: °· Have certain infections during pregnancy such as a bladder infection, sexually transmitted infection, or infection inside the uterus (chorioamnionitis). °· Have a shorter-than-normal cervix. °· Have gone into preterm labor before. °· Have had surgery on their cervix. °· Are younger than age 17 or older than age 35. °· Are African American. °· Are pregnant with twins or multiple babies (multiple gestation). °· Take street drugs or smoke while pregnant. °· Do not gain enough weight while pregnant. °· Became pregnant shortly after having been pregnant. °What are the symptoms of preterm labor? °Symptoms of preterm labor include: °· Cramps similar to those that can happen during a menstrual period. The cramps may happen with diarrhea. °· Pain in the abdomen or lower back. °· Regular uterine contractions that may feel like tightening of the abdomen. °· A feeling of increased pressure in the pelvis. °· Increased watery or bloody mucus discharge from the vagina. °· Water breaking (ruptured amniotic sac). °Why is it important to recognize signs of preterm labor? °It is important to recognize signs of preterm labor because babies who are born prematurely may not be fully developed. This can put them at an increased risk for: °· Long-term (chronic) heart and lung problems. °· Difficulty immediately after birth with regulating body systems, including blood sugar, body temperature, heart rate, and breathing rate. °· Bleeding in the brain. °· Cerebral palsy. °· Learning difficulties. °· Death. °These risks are highest for babies who are born before 34 weeks of pregnancy. °How is preterm labor treated? °Treatment depends on the length of your pregnancy, your condition,  and the health of your baby. It may involve: °· Having a stitch (suture) placed in your cervix to prevent your cervix from opening too early (cerclage). °· Taking or being given medicines, such as: °? Hormone medicines. These may be given early in pregnancy to help support the pregnancy. °? Medicine to stop contractions. °? Medicines to help mature the baby’s lungs. These may be prescribed if the risk of delivery is high. °? Medicines to prevent your baby from developing cerebral palsy. °If the labor happens before 34 weeks of pregnancy, you may need to stay in the hospital. °What should I do if I think I am in preterm labor? °If you think that you are going into preterm labor, call your health care provider right away. °How can I prevent preterm labor in future pregnancies? °To increase your chance of having a full-term pregnancy: °· Do not use any tobacco products, such as cigarettes, chewing tobacco, and e-cigarettes. If you need help quitting, ask your health care provider. °· Do not use street drugs or medicines that have not been prescribed to you during your pregnancy. °· Talk with your health care provider before taking any herbal supplements, even if you have been taking them regularly. °· Make sure you gain a healthy amount of weight during your pregnancy. °· Watch for infection. If you think that you might have an infection, get it checked right away. °· Make sure to tell your health care provider if you have gone into preterm labor before. °This information is not intended to replace advice given to you by your health care provider. Make sure you discuss any questions you have with your   health care provider. Document Released: 12/10/2003 Document Revised: 01/11/2019 Document Reviewed: 02/10/2016 Elsevier Patient Education  2020 Elsevier Inc.    AREA PEDIATRIC/FAMILY PRACTICE PHYSICIANS  [facility] [address] [place], [place] [address] Phone - [phone]   Fax  - [phone]  [facility] AMOS [address] [place], [place]  [address] Phone - [phone]   Fax - [phone]  [facility] [address], [address] [place], [place]  [address] Phone - [phone]   Fax - [phone]  [facility] [address] [place], [place]  [address] Phone - [phone]   Fax - [phone]  [facility] [address], [address] [place], [place]  [address] Phone - [phone]   Fax - [phone]  [facility] [address], [address] [place], [place]  [address] Phone - [phone]   Fax - [phone]  [facility] [place] [address], [address] [place], [place]  [address] Phone - [phone]   Fax - [phone]  [facility] [address], [address] [place], [place]  [address] Phone - [phone]   Fax - [phone]  [facility] [address] [place], [place]  [address] Phone - [phone]   Fax - [phone] [facility] [address] [place], [place]  [address] Phone - [phone]   Fax - [phone]  [facility] [address] [place], [place]  [address] Phone - [phone]   Fax - [phone]  [facility] [address], [address] [place], [place]  [address] Phone - [phone]   Fax - [phone]  [facility] [address], [address] [place], [place]  [address] Phone - [phone]   Fax - [phone]  [facility] [address], [address] [place], [place]  [address] Phone - [phone]  [facility] [address] [place], [place]  [address] Phone - [phone]   Fax - [phone]  [facility] [address], [address] [place], [place]  [address] Phone - [phone]   Fax - [phone]  [facility] [address] [place], [place]   [address] Phone - [phone]   Fax - [phone]  [facility] [address] [place], [place]  [address] Phone - [phone]   Fax - [phone]   [facility] [address], [address] [place], [place]  [address] Phone - [phone]   Fax - [phone]  [facility] [address], [address] [place], [place]  [address] Phone - [phone]   Fax - [phone]  DAVID RUBIN [address] [place], [place]  [address] Phone - [phone]   Fax - [phone]  [facility] [address], [address] [place], [place]  [address] Phone - [phone]   Fax - [phone]  [facility] [address] , [place]  [address] Phone - [phone]   Fax - [phone] [facility] [place] [address] [place], [place]  [address] Phone - [phone]   Fax - [phone]  [facility] [address] [place], [place]  [address] Phone - [phone]   Fax - [phone]  [facility] [place] [address], [address] [place], [place]  [address] Phone - [phone]   Fax - [phone]  Safe Medications in Pregnancy    Acne: Benzoyl Peroxide Salicylic Acid  Backache/Headache: Tylenol: 2 regular strength every 4 hours OR              2 Extra strength every 6 hours  Colds/Coughs/Allergies: Benadryl (alcohol free) 25 mg every 6 hours as needed Breath right strips Claritin Cepacol throat lozenges Chloraseptic throat spray Cold-Eeze- up to three times per day Cough drops, alcohol free Flonase (by prescription only) Guaifenesin Mucinex Robitussin DM (plain only, alcohol free) Saline nasal spray/drops Sudafed (pseudoephedrine) & Actifed ** use only after [redacted] weeks gestation and if you do not have high blood pressure Tylenol Vicks Vaporub Zinc lozenges Zyrtec   Constipation: Colace Ducolax suppositories Fleet  enema Glycerin suppositories Metamucil Milk of magnesia Miralax Senokot Smooth move tea  Diarrhea: Kaopectate Imodium A-D  *NO pepto Bismol  Hemorrhoids: Anusol Anusol HC Preparation H Tucks  Indigestion: Tums Maalox Mylanta Zantac  Pepcid  Insomnia: Benadryl (alcohol free) 25mg  every 6 hours as needed Tylenol PM Unisom, no Gelcaps  Leg Cramps: Tums MagGel  Nausea/Vomiting:  Bonine Dramamine Emetrol Ginger extract Sea bands Meclizine  Nausea medication to take during pregnancy:  Unisom (doxylamine succinate 25 mg tablets) Take one tablet daily at bedtime. If symptoms are not adequately controlled, the dose can be increased to a maximum recommended dose of two tablets daily (1/2 tablet in the morning, 1/2 tablet mid-afternoon and one at bedtime). Vitamin B6 100mg  tablets. Take one tablet twice a day (up to 200 mg per day).  Skin Rashes: Aveeno products Benadryl cream or 25mg  every 6 hours as needed Calamine Lotion 1% cortisone cream  Yeast infection: Gyne-lotrimin 7 Monistat 7   **If taking multiple medications, please check labels to avoid duplicating the same active ingredients **take medication as directed on the label ** Do not exceed 4000 mg of tylenol in 24 hours **Do not take medications that contain aspirin or ibuprofen      PREGNANCY SUPPORT BELT: You are not alone, Seventy-five percent of women have some sort of abdominal or back pain at some point in their pregnancy. Your baby is growing at a fast pace, which means that your whole body is rapidly trying to adjust to the changes. As your uterus grows, your back may start feeling a bit under stress and this can result in back or abdominal pain that can go from mild, and therefore bearable, to severe pains that will not allow you to sit or lay down comfortably, When it comes to dealing with pregnancy-related pains and cramps, some pregnant women usually prefer natural remedies, which the  market is filled with nowadays. For example, wearing a pregnancy support belt can help ease and lessen your discomfort and pain. WHAT ARE THE BENEFITS OF WEARING A PREGNANCY SUPPORT BELT? A pregnancy support belt provides support to the lower portion of the belly taking some of the weight of the growing uterus and distributing to the other parts of your body. It is designed make you comfortable and gives you extra support. Over the years, the pregnancy apparel market has been studying the needs and wants of pregnant women and they have come up with the most comfortable pregnancy support belts that woman could ever ask for. In fact, you will no longer have to wear a stretched-out or bulky pregnancy belt that is visible underneath your clothes and makes you feel even more uncomfortable. Nowadays, a pregnancy support belt is made of comfortable and stretchy materials that will not irritate your skin but  will actually make you feel at ease and you will not even notice you are wearing it. They are easy to put on and adjust during the day and can be worn at night for additional support.  BENEFITS:  Relives Back pain  Relieves Abdominal Muscle and Leg Pain  Stabilizes the Pelvic Ring  Offers a Cushioned Abdominal Lift Pad  Relieves pressure on the Sciatic Nerve Within Minutes WHERE TO GET YOUR PREGNANCY BELT: [employer] [phone] @[address]  [place], [place] [address]

## 2019-06-04 NOTE — Progress Notes (Signed)
Subjective:  Caroline Gaines is a 28 y.o. G1P0000 at 93w0dbeing seen today for ongoing prenatal care.  She is currently monitored for the following issues for this low-risk pregnancy and has ASTHMA; HEADACHE, CHRONIC; Major depressive disorder, recurrent episode, severe ([place]; Generalized anxiety disorder; Major depressive disorder, recurrent, severe without psychotic features ([place]; Supervision of low-risk pregnancy; Loud snoring; Family history of alpha thalassemia; and Alpha thalassemia silent carrier on their problem list.   Pt reports FOB completed blood testing for thalassemia, but she does not know the results. Pt reports she and FOB have already had genetic counseling. Results reported 05/07/2019 uploaded under images.  [facility] but does report that she had a negative TOC at another facility. Pt offered records release vs.self-swab in clinic and accepts self-swab.  Pt denies having initial OB labs drawn. Will perform today.  Patient reports difficulty sleeping due to snoring loud snoring and hip pain. Pt reports she did not snore prior to pregnancy and is now snoring so loud that sometimes she wakes up herself and her partner. and hip pain.  Contractions: Not present. Vag. Bleeding: None.  Movement: Present. Denies leaking of fluid.   The following portions of the patient's history were reviewed and updated as appropriate: allergies, current medications, past family history, past medical history, past social history, past surgical history and problem list. Problem list updated.  Objective:   Vitals:   06/04/19 0829  BP: 137/75  Pulse: 99  Weight: 182 lb (82.6 kg)    Fetal Status: Fetal Heart Rate (bpm): 157 Fundal Height: 24 cm Movement: Present     General:  Alert, oriented and cooperative. Patient is in no acute distress.  Skin: Skin is warm and dry. No rash noted.   Cardiovascular: Normal heart rate noted  Respiratory: Normal  respiratory effort, no problems with respiration noted  Abdomen: Soft, gravid, appropriate for gestational age. Pain/Pressure: Absent     Pelvic: Vag. Bleeding: None     Cervical exam deferred        Extremities: Normal range of motion.  Edema: Trace  Mental Status: Normal mood and affect. Normal behavior. Normal judgment and thought content.   Assessment and Plan:  Pregnancy: G1P0000 at 269w0d1. Encounter for supervision of low-risk pregnancy in second trimester - Elastic Bandages & Supports (COMFORT FIT MATERNITY SUPP SM) MISC; 1 Units by Does not apply route daily as needed.  Dispense: 1 each; Refill: 0 for hip pain - Comp Met (CMET) - Protein / creatinine ratio, urine -baseline preeclampsia labs drawn as BP is steadily increasing since initial OB visit -pt reminded of appt 06/07/2019 for completion of fetal anatomy -pt has not yet had NOB labs drawn, will draw today -pt advised by genetics to have MCV/hgb electrophoresis drawn, will draw at next visit, as pt was unaware of which labs needed to be drawn and information was not available at time of lab draw  2. Encounter for counseling regarding contraception -[facility], pt reports hx of endometriosis and would like to continue pills for relief of condition  3. Trichomonas infection - Cervicovaginal ancillary only( [place]) for TOC today  4. Loud snoring -pt advised to try humidifier, nose strips, elevating head of bed and allergy medication, list of safe meds in pregnancy given -pt to discuss further at next visit if no relief  5. Family History of Alpha Thalassemia/Silent Carrier Alpha-Thalassemia -pt advised to have FOB call clinic to obtain results of blood testing  and determine if further work-up needs to be performed -pt unsure of next steps with genetic counseling, reviewed per report they were to have bloodwork and comprehensive US performed, pt advised to call genetic counselor to discuss next steps  Preterm labor symptoms  and general obstetric precautions including but not limited to vaginal bleeding, contractions, leaking of fluid and fetal movement were reviewed in detail with the patient. Please refer to After Visit Summary for other counseling recommendations.  Return in about 4 weeks (around 07/02/2019) for in-person visit with 28wk labs.   Clarisa Fling, NP 06/04/2019 1:06 PM

## 2019-06-05 LAB — COMPREHENSIVE METABOLIC PANEL
ALT: 17 IU/L (ref 0–32)
AST: 14 IU/L (ref 0–40)
Albumin/Globulin Ratio: 1.4 (ref 1.2–2.2)
Albumin: 3.7 g/dL — ABNORMAL LOW (ref 3.9–5.0)
Alkaline Phosphatase: 61 IU/L (ref 39–117)
BUN/Creatinine Ratio: 13 (ref 9–23)
BUN: 6 mg/dL (ref 6–20)
Bilirubin Total: <0.2 mg/dL (ref 0.0–1.2)
CO2: 20 mmol/L (ref 20–29)
Calcium: 8.7 mg/dL (ref 8.7–10.2)
Chloride: 103 mmol/L (ref 96–106)
Creatinine, Ser: 0.48 mg/dL — ABNORMAL LOW (ref 0.57–1.00)
GFR calc Af Amer: 155 mL/min/{1.73_m2} (ref >59)
GFR calc non Af Amer: 135 mL/min/{1.73_m2} (ref >59)
Globulin, Total: 2.6 g/dL (ref 1.5–4.5)
Glucose: 99 mg/dL (ref 65–99)
Potassium: 4.4 mmol/L (ref 3.5–5.2)
Sodium: 137 mmol/L (ref 134–144)
Total Protein: 6.3 g/dL (ref 6.0–8.5)

## 2019-06-05 LAB — CERVICOVAGINAL ANCILLARY ONLY
Bacterial vaginitis: NEGATIVE
Candida vaginitis: NEGATIVE
Chlamydia: NEGATIVE
Neisseria Gonorrhea: NEGATIVE
Trichomonas: NEGATIVE

## 2019-06-05 LAB — PROTEIN / CREATININE RATIO, URINE
Creatinine, Urine: 113.6 mg/dL
Protein, Ur: 11.5 mg/dL
Protein/Creat Ratio: 101 mg/g{creat} (ref 0–200)

## 2019-06-07 ENCOUNTER — Encounter (HOSPITAL_COMMUNITY): Payer: Self-pay

## 2019-06-07 ENCOUNTER — Other Ambulatory Visit: Payer: Self-pay

## 2019-06-07 ENCOUNTER — Ambulatory Visit (HOSPITAL_COMMUNITY)
Admission: RE | Admit: 2019-06-07 | Discharge: 2019-06-07 | Disposition: A | Discharge status: Home / Self Care | Payer: Medicaid Other | Source: Ambulatory Visit | Attending: Obstetrics and Gynecology | Admitting: Obstetrics and Gynecology

## 2019-06-07 ENCOUNTER — Ambulatory Visit (HOSPITAL_COMMUNITY): Payer: Medicaid Other | Admitting: *Deleted

## 2019-06-07 DIAGNOSIS — Z3492 Encounter for supervision of normal pregnancy, unspecified, second trimester: Secondary | ICD-10-CM | POA: Insufficient documentation

## 2019-06-07 DIAGNOSIS — Z3A24 24 weeks gestation of pregnancy: Secondary | ICD-10-CM

## 2019-06-07 DIAGNOSIS — D563 Thalassemia minor: Secondary | ICD-10-CM | POA: Insufficient documentation

## 2019-06-07 DIAGNOSIS — Z832 Family history of diseases of the blood and blood-forming organs and certain disorders involving the immune mechanism: Secondary | ICD-10-CM

## 2019-06-07 DIAGNOSIS — Z148 Genetic carrier of other disease: Secondary | ICD-10-CM

## 2019-06-07 DIAGNOSIS — Z362 Encounter for other antenatal screening follow-up: Secondary | ICD-10-CM

## 2019-06-28 ENCOUNTER — Telehealth: Payer: Self-pay | Admitting: Family Medicine

## 2019-06-28 NOTE — Telephone Encounter (Signed)
Spoke to patient about her appointment on 9/28 @ 8:15. Patient instructed to wear a face mask for the entire appointment and no visitors are allowed with her during the visit. Patient screened for covid symptoms and denied having any

## 2019-07-01 ENCOUNTER — Ambulatory Visit (INDEPENDENT_AMBULATORY_CARE_PROVIDER_SITE_OTHER): Payer: Medicaid Other | Admitting: Family Medicine

## 2019-07-01 ENCOUNTER — Other Ambulatory Visit: Payer: Self-pay

## 2019-07-01 ENCOUNTER — Other Ambulatory Visit: Payer: Medicaid Other

## 2019-07-01 VITALS — BP 124/60 | HR 84 | Temp 98.5°F | Wt 188.9 lb

## 2019-07-01 DIAGNOSIS — Z3492 Encounter for supervision of normal pregnancy, unspecified, second trimester: Secondary | ICD-10-CM

## 2019-07-01 DIAGNOSIS — Z3A27 27 weeks gestation of pregnancy: Secondary | ICD-10-CM | POA: Diagnosis not present

## 2019-07-01 DIAGNOSIS — Z3490 Encounter for supervision of normal pregnancy, unspecified, unspecified trimester: Secondary | ICD-10-CM

## 2019-07-01 DIAGNOSIS — D563 Thalassemia minor: Secondary | ICD-10-CM

## 2019-07-01 DIAGNOSIS — Z23 Encounter for immunization: Secondary | ICD-10-CM | POA: Diagnosis not present

## 2019-07-01 NOTE — Progress Notes (Signed)
   PRENATAL VISIT NOTE  Subjective:  Caroline Gaines is a 28 y.o. G1P0000 at [redacted]w[redacted]d being seen today for ongoing prenatal care.  She is currently monitored for the following issues for this low-risk pregnancy and has ASTHMA; HEADACHE, CHRONIC; Major depressive disorder, recurrent episode, severe ([place]); Generalized anxiety disorder; Major depressive disorder, recurrent, severe without psychotic features ([place]); Supervision of low-risk pregnancy; Loud snoring; Family history of alpha thalassemia; and Alpha thalassemia silent carrier on their problem list.  Patient reports no complaints.  Contractions: Not present. Vag. Bleeding: None.  Movement: Present. Denies leaking of fluid.   The following portions of the patient's history were reviewed and updated as appropriate: allergies, current medications, past family history, past medical history, past social history, past surgical history and problem list.   Objective:   Vitals:   07/01/19 0826  BP: 124/60  Pulse: 84  Temp: 98.5 F (36.9 C)  Weight: 188 lb 14.4 oz (85.7 kg)    Fetal Status: Fetal Heart Rate (bpm): 145 Fundal Height: 27 cm Movement: Present     General:  Alert, oriented and cooperative. Patient is in no acute distress.  Skin: Skin is warm and dry. No rash noted.   Cardiovascular: Normal heart rate noted  Respiratory: Normal respiratory effort, no problems with respiration noted  Abdomen: Soft, gravid, appropriate for gestational age.  Pain/Pressure: Absent     Pelvic: Cervical exam deferred        Extremities: Normal range of motion.  Edema: Trace  Mental Status: Normal mood and affect. Normal behavior. Normal judgment and thought content.   Assessment and Plan:  Pregnancy: G1P0000 at [redacted]w[redacted]d 1. Alpha thalassemia silent carrier - Hemoglobinopathy evaluation  2. Encounter for supervision of low-risk pregnancy in second trimester 28 wk labs and TDaP and Flu today  Preterm labor symptoms and general obstetric precautions  including but not limited to vaginal bleeding, contractions, leaking of fluid and fetal movement were reviewed in detail with the patient. Please refer to After Visit Summary for other counseling recommendations.   Return in 2 weeks (on 07/15/2019) for virtual.  Future Appointments  Date Time Provider [place]  07/15/2019  3:15 PM Jorje Guild, NP [facility]    Donnamae Jude, MD

## 2019-07-01 NOTE — Patient Instructions (Signed)

## 2019-07-02 LAB — OBSTETRIC PANEL, INCLUDING HIV
Antibody Screen: NEGATIVE
Basophils Absolute: 0.1 10*3/uL (ref 0.0–0.2)
Basos: 1 %
EOS (ABSOLUTE): 0.2 10*3/uL (ref 0.0–0.4)
Eos: 2 %
HIV Screen 4th Generation wRfx: NONREACTIVE
Hematocrit: 32.2 % — ABNORMAL LOW (ref 34.0–46.6)
Hemoglobin: 10.5 g/dL — ABNORMAL LOW (ref 11.1–15.9)
Hepatitis B Surface Ag: NEGATIVE
Immature Grans (Abs): 0.1 10*3/uL (ref 0.0–0.1)
Immature Granulocytes: 1 %
Lymphocytes Absolute: 2.5 10*3/uL (ref 0.7–3.1)
Lymphs: 25 %
MCH: 26.3 pg — ABNORMAL LOW (ref 26.6–33.0)
MCHC: 32.6 g/dL (ref 31.5–35.7)
MCV: 81 fL (ref 79–97)
Monocytes Absolute: 0.6 10*3/uL (ref 0.1–0.9)
Monocytes: 6 %
Neutrophils Absolute: 6.6 10*3/uL (ref 1.4–7.0)
Neutrophils: 65 %
Platelets: 303 10*3/uL (ref 150–450)
RBC: 4 x10E6/uL (ref 3.77–5.28)
RDW: 13.1 % (ref 11.7–15.4)
RPR Ser Ql: NONREACTIVE
Rh Factor: POSITIVE
Rubella Antibodies, IGG: <0.9 index — ABNORMAL LOW (ref >0.99)
WBC: 10.1 10*3/uL (ref 3.4–10.8)

## 2019-07-02 LAB — HEMOGLOBIN A1C
Est. average glucose Bld gHb Est-mCnc: 120 mg/dL
Hgb A1c MFr Bld: 5.8 % — ABNORMAL HIGH (ref 4.8–5.6)

## 2019-07-02 LAB — GLUCOSE TOLERANCE, 2 HOURS W/ 1HR
Glucose, 1 hour: 186 mg/dL — ABNORMAL HIGH (ref 65–179)
Glucose, 2 hour: 127 mg/dL (ref 65–152)
Glucose, Fasting: 98 mg/dL — ABNORMAL HIGH (ref 65–91)

## 2019-07-03 ENCOUNTER — Other Ambulatory Visit: Payer: Self-pay | Admitting: Advanced Practice Midwife

## 2019-07-03 DIAGNOSIS — Z3492 Encounter for supervision of normal pregnancy, unspecified, second trimester: Secondary | ICD-10-CM

## 2019-07-03 DIAGNOSIS — O24419 Gestational diabetes mellitus in pregnancy, unspecified control: Secondary | ICD-10-CM | POA: Insufficient documentation

## 2019-07-03 LAB — HEMOGLOBINOPATHY EVALUATION
HGB C: 0 %
HGB S: 0 %
HGB VARIANT: 0 %
Hemoglobin A2 Quantitation: 2.3 % (ref 1.8–3.2)
Hemoglobin F Quantitation: 0 % (ref 0.0–2.0)
Hgb A: 97.7 % (ref 96.4–98.8)

## 2019-07-03 LAB — URINE CULTURE, OB REFLEX

## 2019-07-03 LAB — CULTURE, OB URINE

## 2019-07-05 ENCOUNTER — Telehealth: Payer: Self-pay | Admitting: *Deleted

## 2019-07-05 NOTE — Telephone Encounter (Signed)
I called Ceniyah and notifed her per provider her 2 hr gtt indicated she has GDM and we recommend meeting with our diabetes educator and she will be called by registrars with first available appointment. I explained she will teach her the diet to follow and how to check her blood sugar. She voices understanding. Jarnell Cordaro,RN

## 2019-07-05 NOTE — Telephone Encounter (Signed)
-----   Message from Tresea Mall, CNM sent at 07/03/2019  8:18 AM EDT ----- Patient  has GDM. Please schedule with diabetes educator.

## 2019-07-11 ENCOUNTER — Telehealth: Payer: Self-pay | Admitting: *Deleted

## 2019-07-11 NOTE — Telephone Encounter (Signed)
Caroline Gaines left  A voicemail this am she is having weird symptoms and pain that comes and goes and nausea. Wants to know if is normal. Asks for a call back. Smera Guyette,RN

## 2019-07-11 NOTE — Telephone Encounter (Signed)
Called patient to get more information concerning patient pain and symptoms. No answer left a voice to call us back.

## 2019-07-15 ENCOUNTER — Telehealth: Payer: Medicaid Other | Admitting: Student

## 2019-07-16 ENCOUNTER — Encounter: Payer: Medicaid Other | Attending: Obstetrics & Gynecology | Admitting: *Deleted

## 2019-07-16 ENCOUNTER — Ambulatory Visit: Payer: Medicaid Other | Admitting: *Deleted

## 2019-07-16 ENCOUNTER — Other Ambulatory Visit: Payer: Self-pay

## 2019-07-16 ENCOUNTER — Encounter: Payer: Self-pay | Admitting: Family Medicine

## 2019-07-16 DIAGNOSIS — O24410 Gestational diabetes mellitus in pregnancy, diet controlled: Secondary | ICD-10-CM

## 2019-07-16 DIAGNOSIS — Z3A00 Weeks of gestation of pregnancy not specified: Secondary | ICD-10-CM | POA: Diagnosis not present

## 2019-07-16 DIAGNOSIS — Z20828 Contact with and (suspected) exposure to other viral communicable diseases: Secondary | ICD-10-CM | POA: Insufficient documentation

## 2019-07-16 MED ORDER — ACCU-CHEK GUIDE W/DEVICE KIT: 1.0000 | PACK | Freq: Once | 0 refills | Status: AC

## 2019-07-16 MED ORDER — ACCU-CHEK FASTCLIX LANCETS MISC: 1.0000 | Freq: Four times a day (QID) | 12 refills | Status: DC

## 2019-07-16 MED ORDER — ACCU-CHEK GUIDE VI STRP: ORAL_STRIP | 12 refills | Status: DC

## 2019-07-16 NOTE — Progress Notes (Signed)
  Patient was seen on 07/16/2019 for Gestational Diabetes self-management. EDD 09/24/2019. Patient states no history of GDM. Diet history obtained. Patient eats good variety of all food groups. Beverages include OJ, water and flavored water.  The following learning objectives were met by the patient :   States the definition of Gestational Diabetes  States why dietary management is important in controlling blood glucose  Describes the effects of carbohydrates on blood glucose levels  Demonstrates ability to create a balanced meal plan  Demonstrates carbohydrate counting   States when to check blood glucose levels  Demonstrates proper blood glucose monitoring techniques  States the effect of stress and exercise on blood glucose levels  States the importance of limiting caffeine and abstaining from alcohol and smoking  Plan:  Aim for 3 Carb Choices per meal (45 grams) +/- 1 either way  Aim for 1-2 Carbs per snack Begin reading food labels for Total Carbohydrate of foods If OK with your MD, consider  increasing your activity level by walking, Arm Chair Exercises or other activity daily as tolerated Begin checking BG before breakfast and 2 hours after first bite of breakfast, lunch and dinner as directed by MD  Bring Log Book/Sheet and meter to every medical appointment OR use Baby Scripts (see below) Baby Scripts:  Patient was introduced to [employer] and plans to use as record of BG electronically  Take medication if directed by MD  Blood glucose monitor Rx called into pharmacy: Accu Check Guide with Fast Clix drums Patient instructed to test pre breakfast and 2 hours each meal as directed by MD  Patient instructed to monitor glucose levels: FBS: 60 - 95 mg/dl 2 hour: <120 mg/dl  Patient received the following handouts:  Nutrition Diabetes and Pregnancy  Carbohydrate Counting List  Patient will be seen for follow-up as needed.

## 2019-07-17 ENCOUNTER — Other Ambulatory Visit: Payer: Self-pay | Admitting: Emergency Medicine

## 2019-07-17 DIAGNOSIS — O24410 Gestational diabetes mellitus in pregnancy, diet controlled: Secondary | ICD-10-CM

## 2019-07-17 MED ORDER — ACCU-CHEK GUIDE VI STRP: ORAL_STRIP | 12 refills | Status: DC

## 2019-07-17 MED ORDER — ACCU-CHEK FASTCLIX LANCETS MISC: 1.0000 | Freq: Four times a day (QID) | 12 refills | Status: DC

## 2019-07-19 ENCOUNTER — Other Ambulatory Visit: Payer: Self-pay | Admitting: Family Medicine

## 2019-07-19 DIAGNOSIS — O24410 Gestational diabetes mellitus in pregnancy, diet controlled: Secondary | ICD-10-CM

## 2019-07-22 ENCOUNTER — Other Ambulatory Visit: Payer: Self-pay | Admitting: Lactation Services

## 2019-07-22 MED ORDER — GLUCOSE BLOOD VI STRP: ORAL_STRIP | 12 refills | Status: DC

## 2019-07-22 NOTE — Progress Notes (Signed)
Test strips reordered to correct testing strips for pts Glucometer.

## 2019-07-23 ENCOUNTER — Other Ambulatory Visit: Payer: Self-pay | Admitting: Lactation Services

## 2019-07-23 ENCOUNTER — Other Ambulatory Visit: Payer: Self-pay | Admitting: Family Medicine

## 2019-07-23 ENCOUNTER — Telehealth: Payer: Self-pay | Admitting: Lactation Services

## 2019-07-23 DIAGNOSIS — O24410 Gestational diabetes mellitus in pregnancy, diet controlled: Secondary | ICD-10-CM

## 2019-07-23 MED ORDER — ACCU-CHEK GUIDE VI STRP: ORAL_STRIP | 12 refills | Status: DC

## 2019-07-23 NOTE — Progress Notes (Signed)
Reordered test strip as pt having difficulty getting test strips for check blood sugars.

## 2019-07-23 NOTE — Telephone Encounter (Signed)
Attempted to call pt about blood glucose monitoring equipment. Was not able to reach her nor leave a message. Will send a My Chart Message.

## 2019-07-24 ENCOUNTER — Ambulatory Visit (INDEPENDENT_AMBULATORY_CARE_PROVIDER_SITE_OTHER): Payer: Medicaid Other | Admitting: Obstetrics & Gynecology

## 2019-07-24 ENCOUNTER — Other Ambulatory Visit: Payer: Self-pay

## 2019-07-24 VITALS — BP 109/63 | HR 84 | Temp 98.5°F | Wt 189.0 lb

## 2019-07-24 DIAGNOSIS — Z3A31 31 weeks gestation of pregnancy: Secondary | ICD-10-CM

## 2019-07-24 DIAGNOSIS — Z3492 Encounter for supervision of normal pregnancy, unspecified, second trimester: Secondary | ICD-10-CM

## 2019-07-24 NOTE — Patient Instructions (Signed)

## 2019-07-24 NOTE — Progress Notes (Signed)
   PRENATAL VISIT NOTE  Subjective:  Caroline Gaines is a 28 y.o. G1P0000 at [redacted]w[redacted]d being seen today for ongoing prenatal care.  She is currently monitored for the following issues for this high-risk pregnancy and has ASTHMA; HEADACHE, CHRONIC; Major depressive disorder, recurrent episode, severe ([place]); Generalized anxiety disorder; Major depressive disorder, recurrent, severe without psychotic features ([place]); Supervision of low-risk pregnancy; Loud snoring; Family history of alpha thalassemia; Alpha thalassemia silent carrier; and Gestational diabetes on their problem list.  Patient reports right leg pain.  Contractions: Irritability. Vag. Bleeding: None.  Movement: Present. Denies leaking of fluid.   The following portions of the patient's history were reviewed and updated as appropriate: allergies, current medications, past family history, past medical history, past social history, past surgical history and problem list.   Objective:   Vitals:   07/24/19 1148  BP: 109/63  Pulse: 84  Temp: 98.5 F (36.9 C)  Weight: 189 lb (85.7 kg)    Fetal Status: Fetal Heart Rate (bpm): 150   Movement: Present     General:  Alert, oriented and cooperative. Patient is in no acute distress.  Skin: Skin is warm and dry. No rash noted.   Cardiovascular: Normal heart rate noted  Respiratory: Normal respiratory effort, no problems with respiration noted  Abdomen: Soft, gravid, appropriate for gestational age.  Pain/Pressure: Present     Pelvic: Cervical exam deferred        Extremities: Normal range of motion.  Edema: Trace  Mental Status: Normal mood and affect. Normal behavior. Normal judgment and thought content.   Assessment and Plan:  Pregnancy: G1P0000 at [redacted]w[redacted]d 1. Encounter for supervision of low-risk pregnancy in second trimester Leg pain not improved - Ambulatory referral to Chiropractic Start BG testing and RTC 2 weeks Preterm labor symptoms and general obstetric precautions including but  not limited to vaginal bleeding, contractions, leaking of fluid and fetal movement were reviewed in detail with the patient. Please refer to After Visit Summary for other counseling recommendations.   Return in about 2 weeks (around 08/07/2019).  Future Appointments  Date Time Provider [place]  08/07/2019  9:55 AM Donnamae Jude, MD [facility]    Emeterio Reeve, MD

## 2019-07-24 NOTE — Progress Notes (Signed)
Pt states still having pain in right leg.

## 2019-07-27 ENCOUNTER — Encounter: Payer: Self-pay | Admitting: Family Medicine

## 2019-08-02 ENCOUNTER — Other Ambulatory Visit: Payer: Self-pay

## 2019-08-02 DIAGNOSIS — Z20822 Contact with and (suspected) exposure to covid-19: Secondary | ICD-10-CM

## 2019-08-03 LAB — NOVEL CORONAVIRUS, NAA: SARS-CoV-2, NAA: NOT DETECTED

## 2019-08-06 ENCOUNTER — Telehealth: Payer: Self-pay | Admitting: Family Medicine

## 2019-08-06 ENCOUNTER — Encounter: Payer: Self-pay | Admitting: Family Medicine

## 2019-08-07 ENCOUNTER — Other Ambulatory Visit: Payer: Self-pay

## 2019-08-07 ENCOUNTER — Telehealth (INDEPENDENT_AMBULATORY_CARE_PROVIDER_SITE_OTHER): Payer: Medicaid Other | Admitting: Family Medicine

## 2019-08-07 VITALS — BP 113/76 | HR 82

## 2019-08-07 DIAGNOSIS — O0993 Supervision of high risk pregnancy, unspecified, third trimester: Secondary | ICD-10-CM

## 2019-08-07 DIAGNOSIS — O24410 Gestational diabetes mellitus in pregnancy, diet controlled: Secondary | ICD-10-CM

## 2019-08-07 DIAGNOSIS — Z3A33 33 weeks gestation of pregnancy: Secondary | ICD-10-CM

## 2019-08-07 DIAGNOSIS — O0990 Supervision of high risk pregnancy, unspecified, unspecified trimester: Secondary | ICD-10-CM

## 2019-08-07 DIAGNOSIS — K5901 Slow transit constipation: Secondary | ICD-10-CM

## 2019-08-07 MED ORDER — SENNA-DOCUSATE SODIUM 8.6-50 MG PO TABS: 1.0000 | ORAL_TABLET | Freq: Every day | ORAL | 2 refills | Status: DC

## 2019-08-07 NOTE — Progress Notes (Signed)
   TELEHEALTH OBSTETRICS PRENATAL VIRTUAL VIDEO VISIT ENCOUNTER NOTE  Provider location: [facility] at [place]   I connected with [name] on 08/07/19 at  9:55 AM EST by MyChart Video Encounter at home and verified that I am speaking with the correct person using two identifiers.   I discussed the limitations, risks, security and privacy concerns of performing an evaluation and management service virtually and the availability of in person appointments. I also discussed with the patient that there may be a patient responsible charge related to this service. The patient expressed understanding and agreed to proceed. Subjective:  Caroline Gaines is a 28 y.o. G1P0000 at [redacted]w[redacted]d being seen today for ongoing prenatal care.  She is currently monitored for the following issues for this high-risk pregnancy and has ASTHMA; HEADACHE, CHRONIC; Major depressive disorder, recurrent episode, severe ([place]); Generalized anxiety disorder; Major depressive disorder, recurrent, severe without psychotic features ([place]); Supervision of high risk pregnancy, antepartum; Loud snoring; Family history of alpha thalassemia; Alpha thalassemia silent carrier; and Gestational diabetes on their problem list.  Patient reports constipation with gaseous feeling. Inability to keep up with CBGs, leg pain, one sided,wants referral to chriopractor or PT. Needs note for work due to [place] exposure..  Contractions: Irritability. Vag. Bleeding: None.  Movement: Present. Denies any leaking of fluid.   The following portions of the patient's history were reviewed and updated as appropriate: allergies, current medications, past family history, past medical history, past social history, past surgical history and problem list.   Objective:   Vitals:   08/07/19 0936  BP: 113/76  Pulse: 82    Fetal Status:     Movement: Present     General:  Alert, oriented and cooperative. Patient is in no acute distress.   Respiratory: Normal respiratory effort, no problems with respiration noted  Mental Status: Normal mood and affect. Normal behavior. Normal judgment and thought content.  Rest of physical exam deferred due to type of encounter  Imaging: No results found.  Assessment and Plan:  Pregnancy: G1P0000 at [redacted]w[redacted]d 1. Diet controlled gestational diabetes mellitus (GDM) in third trimester FBS reports difficulty in trying to take her CBGs 2 hour pp some numbers are high Will attempt to add exercise and protein to meals. Note given due to COVID  2. Supervision of high risk pregnancy, antepartum   3. Slow transit constipation Adding stool softener with mild laxative. - sennosides-docusate sodium (SENOKOT-S) 8.6-50 MG tablet; Take 1 tablet by mouth daily.  Dispense: 60 tablet; Refill: 2  Preterm labor symptoms and general obstetric precautions including but not limited to vaginal bleeding, contractions, leaking of fluid and fetal movement were reviewed in detail with the patient. I discussed the assessment and treatment plan with the patient. The patient was provided an opportunity to ask questions and all were answered. The patient agreed with the plan and demonstrated an understanding of the instructions. The patient was advised to call back or seek an in-person office evaluation/go to [facility] for any urgent or concerning symptoms. Please refer to After Visit Summary for other counseling recommendations.   I provided 12 minutes of face-to-face time during this encounter.  Return in 1 week (on 08/14/2019) for referral to chiropractor, or physical therapy for sciatica, virtual, HRC, needs MD.  No future appointments.  Donnamae Jude, MD [facility], [place]

## 2019-08-07 NOTE — Patient Instructions (Addendum)
 Breastfeeding  Choosing to breastfeed is one of the best decisions you can make for yourself and your baby. A change in hormones during pregnancy causes your breasts to make breast milk in your milk-producing glands. Hormones prevent breast milk from being released before your baby is born. They also prompt milk flow after birth. Once breastfeeding has begun, thoughts of your baby, as well as his or her sucking or crying, can stimulate the release of milk from your milk-producing glands. Benefits of breastfeeding Research shows that breastfeeding offers many health benefits for infants and mothers. It also offers a cost-free and convenient way to feed your baby. For your baby  Your first milk (colostrum) helps your baby's digestive system to function better.  Special cells in your milk (antibodies) help your baby to fight off infections.  Breastfed babies are less likely to develop asthma, allergies, obesity, or type 2 diabetes. They are also at lower risk for sudden infant death syndrome (SIDS).  Nutrients in breast milk are better able to meet your baby's needs compared to infant formula.  Breast milk improves your baby's brain development. For you  Breastfeeding helps to create a very special bond between you and your baby.  Breastfeeding is convenient. Breast milk costs nothing and is always available at the correct temperature.  Breastfeeding helps to burn calories. It helps you to lose the weight that you gained during pregnancy.  Breastfeeding makes your uterus return faster to its size before pregnancy. It also slows bleeding (lochia) after you give birth.  Breastfeeding helps to lower your risk of developing type 2 diabetes, osteoporosis, rheumatoid arthritis, cardiovascular disease, and breast, ovarian, uterine, and endometrial cancer later in life. Breastfeeding basics Starting breastfeeding  Find a comfortable place to sit or lie down, with your neck and back  well-supported.  Place a pillow or a rolled-up blanket under your baby to bring him or her to the level of your breast (if you are seated). Nursing pillows are specially designed to help support your arms and your baby while you breastfeed.  Make sure that your baby's tummy (abdomen) is facing your abdomen.  Gently massage your breast. With your fingertips, massage from the outer edges of your breast inward toward the nipple. This encourages milk flow. If your milk flows slowly, you may need to continue this action during the feeding.  Support your breast with 4 fingers underneath and your thumb above your nipple (make the letter "C" with your hand). Make sure your fingers are well away from your nipple and your baby's mouth.  Stroke your baby's lips gently with your finger or nipple.  When your baby's mouth is open wide enough, quickly bring your baby to your breast, placing your entire nipple and as much of the areola as possible into your baby's mouth. The areola is the colored area around your nipple. ? More areola should be visible above your baby's upper lip than below the lower lip. ? Your baby's lips should be opened and extended outward (flanged) to ensure an adequate, comfortable latch. ? Your baby's tongue should be between his or her lower gum and your breast.  Make sure that your baby's mouth is correctly positioned around your nipple (latched). Your baby's lips should create a seal on your breast and be turned out (everted).  It is common for your baby to suck about 2-3 minutes in order to start the flow of breast milk. Latching Teaching your baby how to latch onto your breast properly   is very important. An improper latch can cause nipple pain, decreased milk supply, and poor weight gain in your baby. Also, if your baby is not latched onto your nipple properly, he or she may swallow some air during feeding. This can make your baby fussy. Burping your baby when you switch breasts  during the feeding can help to get rid of the air. However, teaching your baby to latch on properly is still the best way to prevent fussiness from swallowing air while breastfeeding. Signs that your baby has successfully latched onto your nipple  Silent tugging or silent sucking, without causing you pain. Infant's lips should be extended outward (flanged).  Swallowing heard between every 3-4 sucks once your milk has started to flow (after your let-down milk reflex occurs).  Muscle movement above and in front of his or her ears while sucking. Signs that your baby has not successfully latched onto your nipple  Sucking sounds or smacking sounds from your baby while breastfeeding.  Nipple pain. If you think your baby has not latched on correctly, slip your finger into the corner of your baby's mouth to break the suction and place it between your baby's gums. Attempt to start breastfeeding again. Signs of successful breastfeeding Signs from your baby  Your baby will gradually decrease the number of sucks or will completely stop sucking.  Your baby will fall asleep.  Your baby's body will relax.  Your baby will retain a small amount of milk in his or her mouth.  Your baby will let go of your breast by himself or herself. Signs from you  Breasts that have increased in firmness, weight, and size 1-3 hours after feeding.  Breasts that are softer immediately after breastfeeding.  Increased milk volume, as well as a change in milk consistency and color by the fifth day of breastfeeding.  Nipples that are not sore, cracked, or bleeding. Signs that your baby is getting enough milk  Wetting at least 1-2 diapers during the first 24 hours after birth.  Wetting at least 5-6 diapers every 24 hours for the first week after birth. The urine should be clear or pale yellow by the age of 5 days.  Wetting 6-8 diapers every 24 hours as your baby continues to grow and develop.  At least 3 stools in  a 24-hour period by the age of 5 days. The stool should be soft and yellow.  At least 3 stools in a 24-hour period by the age of 7 days. The stool should be seedy and yellow.  No loss of weight greater than 10% of birth weight during the first 3 days of life.  Average weight gain of 4-7 oz (113-198 g) per week after the age of 4 days.  Consistent daily weight gain by the age of 5 days, without weight loss after the age of 2 weeks. After a feeding, your baby may spit up a small amount of milk. This is normal. Breastfeeding frequency and duration Frequent feeding will help you make more milk and can prevent sore nipples and extremely full breasts (breast engorgement). Breastfeed when you feel the need to reduce the fullness of your breasts or when your baby shows signs of hunger. This is called "breastfeeding on demand." Signs that your baby is hungry include:  Increased alertness, activity, or restlessness.  Movement of the head from side to side.  Opening of the mouth when the corner of the mouth or cheek is stroked (rooting).  Increased sucking sounds, smacking lips,   cooing, sighing, or squeaking.  Hand-to-mouth movements and sucking on fingers or hands.  Fussing or crying. Avoid introducing a pacifier to your baby in the first 4-6 weeks after your baby is born. After this time, you may choose to use a pacifier. Research has shown that pacifier use during the first year of a baby's life decreases the risk of sudden infant death syndrome (SIDS). Allow your baby to feed on each breast as long as he or she wants. When your baby unlatches or falls asleep while feeding from the first breast, offer the second breast. Because newborns are often sleepy in the first few weeks of life, you may need to awaken your baby to get him or her to feed. Breastfeeding times will vary from baby to baby. However, the following rules can serve as a guide to help you make sure that your baby is properly fed:   Newborns (babies 4 weeks of age or younger) may breastfeed every 1-3 hours.  Newborns should not go without breastfeeding for longer than 3 hours during the day or 5 hours during the night.  You should breastfeed your baby a minimum of 8 times in a 24-hour period. Breast milk pumping     Pumping and storing breast milk allows you to make sure that your baby is exclusively fed your breast milk, even at times when you are unable to breastfeed. This is especially important if you go back to work while you are still breastfeeding, or if you are not able to be present during feedings. Your lactation consultant can help you find a method of pumping that works best for you and give you guidelines about how long it is safe to store breast milk. Caring for your breasts while you breastfeed Nipples can become dry, cracked, and sore while breastfeeding. The following recommendations can help keep your breasts moisturized and healthy:  Avoid using soap on your nipples.  Wear a supportive bra designed especially for nursing. Avoid wearing underwire-style bras or extremely tight bras (sports bras).  Air-dry your nipples for 3-4 minutes after each feeding.  Use only cotton bra pads to absorb leaked breast milk. Leaking of breast milk between feedings is normal.  Use lanolin on your nipples after breastfeeding. Lanolin helps to maintain your skin's normal moisture barrier. Pure lanolin is not harmful (not toxic) to your baby. You may also hand express a few drops of breast milk and gently massage that milk into your nipples and allow the milk to air-dry. In the first few weeks after giving birth, some women experience breast engorgement. Engorgement can make your breasts feel heavy, warm, and tender to the touch. Engorgement peaks within 3-5 days after you give birth. The following recommendations can help to ease engorgement:  Completely empty your breasts while breastfeeding or pumping. You may want to  start by applying warm, moist heat (in the shower or with warm, water-soaked hand towels) just before feeding or pumping. This increases circulation and helps the milk flow. If your baby does not completely empty your breasts while breastfeeding, pump any extra milk after he or she is finished.  Apply ice packs to your breasts immediately after breastfeeding or pumping, unless this is too uncomfortable for you. To do this: ? Put ice in a plastic bag. ? Place a towel between your skin and the bag. ? Leave the ice on for 20 minutes, 2-3 times a day.  Make sure that your baby is latched on and positioned properly while breastfeeding.   If engorgement persists after 48 hours of following these recommendations, contact your health care provider or a Science writer. Overall health care recommendations while breastfeeding  Eat 3 healthy meals and 3 snacks every day. Well-nourished mothers who are breastfeeding need an additional 450-500 calories a day. You can meet this requirement by increasing the amount of a balanced diet that you eat.  Drink enough water to keep your urine pale yellow or clear.  Rest often, relax, and continue to take your prenatal vitamins to prevent fatigue, stress, and low vitamin and mineral levels in your body (nutrient deficiencies).  Do not use any products that contain nicotine or tobacco, such as cigarettes and e-cigarettes. Your baby may be harmed by chemicals from cigarettes that pass into breast milk and exposure to secondhand smoke. If you need help quitting, ask your health care provider.  Avoid alcohol.  Do not use illegal drugs or marijuana.  Talk with your health care provider before taking any medicines. These include over-the-counter and prescription medicines as well as vitamins and herbal supplements. Some medicines that may be harmful to your baby can pass through breast milk.  It is possible to become pregnant while breastfeeding. If birth control is  desired, ask your health care provider about options that will be safe while breastfeeding your baby. Where to find more information: [employer]: www.llli.org Contact a health care provider if:  You feel like you want to stop breastfeeding or have become frustrated with breastfeeding.  Your nipples are cracked or bleeding.  Your breasts are red, tender, or warm.  You have: ? Painful breasts or nipples. ? A swollen area on either breast. ? A fever or chills. ? Nausea or vomiting. ? Drainage other than breast milk from your nipples.  Your breasts do not become full before feedings by the fifth day after you give birth.  You feel sad and depressed.  Your baby is: ? Too sleepy to eat well. ? Having trouble sleeping. ? More than 14 week old and wetting fewer than 6 diapers in a 24-hour period. ? Not gaining weight by 97 days of age.  Your baby has fewer than 3 stools in a 24-hour period.  Your baby's skin or the white parts of his or her eyes become yellow. Get help right away if:  Your baby is overly tired (lethargic) and does not want to wake up and feed.  Your baby develops an unexplained fever. Summary  Breastfeeding offers many health benefits for infant and mothers.  Try to breastfeed your infant when he or she shows early signs of hunger.  Gently tickle or stroke your baby's lips with your finger or nipple to allow the baby to open his or her mouth. Bring the baby to your breast. Make sure that much of the areola is in your baby's mouth. Offer one side and burp the baby before you offer the other side.  Talk with your health care provider or lactation consultant if you have questions or you face problems as you breastfeed. This information is not intended to replace advice given to you by your health care provider. Make sure you discuss any questions you have with your health care provider. Document Released: 09/19/2005 Document Revised: 12/14/2017  Document Reviewed: 10/21/2016 Elsevier Patient Education  2020 [place].  Sciatica  Sciatica is pain, numbness, weakness, or tingling along the path of the sciatic nerve. The sciatic nerve starts in the lower back and runs down the back of each  leg. The nerve controls the muscles in the lower leg and in the back of the knee. It also provides feeling (sensation) to the back of the thigh, the lower leg, and the sole of the foot. Sciatica is a symptom of another medical condition that pinches or puts pressure on the sciatic nerve. Sciatica most often only affects one side of the body. Sciatica usually goes away on its own or with treatment. In some cases, sciatica may come back (recur). What are the causes? This condition is caused by pressure on the sciatic nerve or pinching of the nerve. This may be the result of:  A disk in between the bones of the spine bulging out too far (herniated disk).  Age-related changes in the spinal disks.  A pain disorder that affects a muscle in the buttock.  Extra bone growth near the sciatic nerve.  A break (fracture) of the pelvis.  Pregnancy.  Tumor. This is rare. What increases the risk? The following factors may make you more likely to develop this condition:  Playing sports that place pressure or stress on the spine.  Having poor strength and flexibility.  A history of back injury or surgery.  Sitting for long periods of time.  Doing activities that involve repetitive bending or lifting.  Obesity. What are the signs or symptoms? Symptoms can vary from mild to very severe, and they may include:  Any of these problems in the lower back, leg, hip, or buttock: ? Mild tingling, numbness, or dull aches. ? Burning sensations. ? Sharp pains.  Numbness in the back of the calf or the sole of the foot.  Leg weakness.  Severe back pain that makes movement difficult. Symptoms may get worse when you cough, sneeze, or laugh, or when you sit  or stand for long periods of time. How is this diagnosed? This condition may be diagnosed based on:  Your symptoms and medical history.  A physical exam.  Blood tests.  Imaging tests, such as: ? X-rays. ? MRI. ? CT scan. How is this treated? In many cases, this condition improves on its own without treatment. However, treatment may include:  Reducing or modifying physical activity.  Exercising and stretching.  Icing and applying heat to the affected area.  Medicines that help to: ? Relieve pain and swelling. ? Relax your muscles.  Injections of medicines that help to relieve pain, irritation, and inflammation around the sciatic nerve (steroids).  Surgery. Follow these instructions at home: Medicines  Take over-the-counter and prescription medicines only as told by your health care provider.  Ask your health care provider if the medicine prescribed to you: ? Requires you to avoid driving or using heavy machinery. ? Can cause constipation. You may need to take these actions to prevent or treat constipation:  Drink enough fluid to keep your urine pale yellow.  Take over-the-counter or prescription medicines.  Eat foods that are high in fiber, such as beans, whole grains, and fresh fruits and vegetables.  Limit foods that are high in fat and processed sugars, such as fried or sweet foods. Managing pain      If directed, put ice on the affected area. ? Put ice in a plastic bag. ? Place a towel between your skin and the bag. ? Leave the ice on for 20 minutes, 2-3 times a day.  If directed, apply heat to the affected area. Use the heat source that your health care provider recommends, such as a moist heat pack or a  heating pad. ? Place a towel between your skin and the heat source. ? Leave the heat on for 20-30 minutes. ? Remove the heat if your skin turns bright red. This is especially important if you are unable to feel pain, heat, or cold. You may have a greater  risk of getting burned. Activity   Return to your normal activities as told by your health care provider. Ask your health care provider what activities are safe for you.  Avoid activities that make your symptoms worse.  Take brief periods of rest throughout the day. ? When you rest for longer periods, mix in some mild activity or stretching between periods of rest. This will help to prevent stiffness and pain. ? Avoid sitting for long periods of time without moving. Get up and move around at least one time each hour.  Exercise and stretch regularly, as told by your health care provider.  Do not lift anything that is heavier than 10 lb (4.5 kg) while you have symptoms of sciatica. When you do not have symptoms, you should still avoid heavy lifting, especially repetitive heavy lifting.  When you lift objects, always use proper lifting technique, which includes: ? Bending your knees. ? Keeping the load close to your body. ? Avoiding twisting. General instructions  Maintain a healthy weight. Excess weight puts extra stress on your back.  Wear supportive, comfortable shoes. Avoid wearing high heels.  Avoid sleeping on a mattress that is too soft or too hard. A mattress that is firm enough to support your back when you sleep may help to reduce your pain.  Keep all follow-up visits as told by your health care provider. This is important. Contact a health care provider if:  You have pain that: ? Wakes you up when you are sleeping. ? Gets worse when you lie down. ? Is worse than you have experienced in the past. ? Lasts longer than 4 weeks.  You have an unexplained weight loss. Get help right away if:  You are not able to control when you urinate or have bowel movements (incontinence).  You have: ? Weakness in your lower back, pelvis, buttocks, or legs that gets worse. ? Redness or swelling of your back. ? A burning sensation when you urinate. Summary  Sciatica is pain,  numbness, weakness, or tingling along the path of the sciatic nerve.  This condition is caused by pressure on the sciatic nerve or pinching of the nerve.  Sciatica can cause pain, numbness, or tingling in the lower back, legs, hips, and buttocks.  Treatment often includes rest, exercise, medicines, and applying ice or heat. This information is not intended to replace advice given to you by your health care provider. Make sure you discuss any questions you have with your health care provider. Document Released: 09/13/2001 Document Revised: 10/08/2018 Document Reviewed: 10/08/2018 Elsevier Patient Education  2020 [employer].

## 2019-08-07 NOTE — Progress Notes (Signed)
I connected with  Caroline Gaines on 08/07/19 at  9:55 AM EST by telephone and verified that I am speaking with the correct person using two identifiers.   I discussed the limitations, risks, security and privacy concerns of performing an evaluation and management service by telephone and the availability of in person appointments. I also discussed with the patient that there may be a patient responsible charge related to this service. The patient expressed understanding and agreed to proceed.     Verdell Carmine, RN  08/07/2019  9:35 AM

## 2019-08-08 ENCOUNTER — Encounter: Payer: Self-pay | Admitting: Family Medicine

## 2019-08-11 ENCOUNTER — Encounter (HOSPITAL_COMMUNITY): Payer: Self-pay | Admitting: *Deleted

## 2019-08-11 ENCOUNTER — Other Ambulatory Visit: Payer: Self-pay

## 2019-08-11 ENCOUNTER — Inpatient Hospital Stay (HOSPITAL_COMMUNITY)
Admission: AD | Admit: 2019-08-11 | Discharge: 2019-08-11 | Disposition: A | Discharge status: Home / Self Care | Payer: Medicaid Other | Attending: Obstetrics & Gynecology | Admitting: Obstetrics & Gynecology

## 2019-08-11 DIAGNOSIS — M5431 Sciatica, right side: Secondary | ICD-10-CM | POA: Insufficient documentation

## 2019-08-11 DIAGNOSIS — D563 Thalassemia minor: Secondary | ICD-10-CM

## 2019-08-11 DIAGNOSIS — O479 False labor, unspecified: Secondary | ICD-10-CM

## 2019-08-11 DIAGNOSIS — O368130 Decreased fetal movements, third trimester, not applicable or unspecified: Secondary | ICD-10-CM | POA: Diagnosis not present

## 2019-08-11 DIAGNOSIS — O26893 Other specified pregnancy related conditions, third trimester: Secondary | ICD-10-CM | POA: Diagnosis not present

## 2019-08-11 DIAGNOSIS — Z3A34 34 weeks gestation of pregnancy: Secondary | ICD-10-CM

## 2019-08-11 DIAGNOSIS — O4703 False labor before 37 completed weeks of gestation, third trimester: Secondary | ICD-10-CM | POA: Diagnosis not present

## 2019-08-11 DIAGNOSIS — Z3A33 33 weeks gestation of pregnancy: Secondary | ICD-10-CM | POA: Diagnosis not present

## 2019-08-11 DIAGNOSIS — Z832 Family history of diseases of the blood and blood-forming organs and certain disorders involving the immune mechanism: Secondary | ICD-10-CM

## 2019-08-11 DIAGNOSIS — R109 Unspecified abdominal pain: Secondary | ICD-10-CM | POA: Insufficient documentation

## 2019-08-11 DIAGNOSIS — O0990 Supervision of high risk pregnancy, unspecified, unspecified trimester: Secondary | ICD-10-CM

## 2019-08-11 LAB — URINALYSIS, ROUTINE W REFLEX MICROSCOPIC
Bilirubin Urine: NEGATIVE
Glucose, UA: NEGATIVE mg/dL
Hgb urine dipstick: NEGATIVE
Ketones, ur: NEGATIVE mg/dL
Leukocytes,Ua: NEGATIVE
Nitrite: NEGATIVE
Protein, ur: NEGATIVE mg/dL
Specific Gravity, Urine: 1.019 (ref 1.005–1.030)
pH: 7 (ref 5.0–8.0)

## 2019-08-11 MED ORDER — CYCLOBENZAPRINE HCL 10 MG PO TABS: 5.0000 mg | ORAL_TABLET | Freq: Three times a day (TID) | ORAL | 1 refills | Status: DC | PRN

## 2019-08-11 NOTE — MAU Provider Note (Signed)
CC:  Chief Complaint  Patient presents with  . Abdominal Pain  . Leg Pain  . Decreased Fetal Movement     First Provider Initiated Contact with Patient 08/11/19 1507      HPI: Caroline Gaines is a 28 y.o. year old [id number] female at [redacted]w[redacted]d weeks gestation who presents to MAU reporting cramping x a few days and exacerbation of what she has been told is sciatica. Pain starts in right low back and radiates down right leg. Make sit very painfeul to walk and roll over when sleeping so much so that she couldn't sleep and had to call out of work.   Doesn't think she is contracting, but states her upper uterus tightens intermittently.   Associated Sx:  Vaginal bleeding: Denies Leaking of fluid: Denies  Fetal movement: decreased  O:  Patient Vitals for the past 24 hrs:  BP Temp Temp src Pulse Resp SpO2 Height Weight  08/11/19 1421 121/74 98.2 F (36.8 C) Oral 97 14 100 % - -  08/11/19 1416 - - - - - - 5\' 5"  (1.651 m) 85.8 kg    General: NAD Heart: Regular rate Lungs: Normal rate and effort Abd: Soft, NT, Gravid, S=D Pelvic: NEFG, negative pooling, negative blood.  Dilation: Closed/thick Exam by:: Marlou Porch CNM  EFM: 130, Moderate variability, 15 x 15 accelerations, no decelerations Toco: UI  Orders Placed This Encounter  Procedures  . OB Urine Culture  . Urinalysis, Routine w reflex microscopic  . Discharge patient   Meds ordered this encounter  Medications  . cyclobenzaprine (FLEXERIL) 10 MG tablet    Sig: Take 0.5-1 tablets (5-10 mg total) by mouth 3 (three) times daily as needed for muscle spasms.    Dispense:  30 tablet    Refill:  1    Order Specific Question:   Supervising Provider    Answer:   Verita Schneiders A [[id number]]   MDM -Braxton Hicks contractions without evidence of active preterm labor -Right-sided sciatica.  Lengthy conversation about comfort measures, physical therapy.  (Contact information given for Earlie Counts, PT). -Decreased fetal movement resolved.   Fetal heart rate reactive.  A: [redacted]w[redacted]d week IUP 1. Braxton Hicks contractions   2. Chronic sciatica, right   3. Decreased fetal movements in third trimester, single or unspecified fetus--resolved, fetal heart rate reactive   P: Discharge home in stable condition. Preterm labor precautions and fetal kick counts. Extensive conversation about comfort measures, stretches and physical therapy for sciatica. Follow-up Information    Harlow Mares therapist Follow up.   Why: [facility] [place], [place], [place] [address] [place] for [facility] Follow up.   Specialty: Obstetrics and Gynecology Why: As scheduled or sooner as needed if symptoms worsen Contact information: [address], [place] [id number] [place] [id number] [facility] Follow up.   Specialty: Obstetrics and Gynecology Why: As needed in pregnancy emergencies Contact information: [address] [id number] [place] [phone]         Allergies as of 08/11/2019   No Known Allergies     Medication List    TAKE these medications   acetaminophen 500 MG tablet Commonly known as: TYLENOL Take 1,000 mg by mouth every 6 (six) hours as needed for headache. Reported on 11/16/2015   AMBULATORY NON FORMULARY MEDICATION 1 Device by Other  route once a week. Blood pressure cuff/ regular Monitored regularly at home ICD 10: Z34.90   Comfort Fit Maternity Supp Sm Misc 1 Units by Does not apply route daily as needed.   cyclobenzaprine 10 MG tablet Commonly known as: FLEXERIL Take 0.5-1 tablets (5-10 mg total) by mouth 3 (three) times daily as needed for muscle spasms.   glucose blood test strip Test Blood Glucose 4 x a day. Fasting and 2 hours after breakfast, lunch and dinner.   Accu-Chek Guide test  strip Generic drug: glucose blood Check Blood Sugars 4 times a day, Fasting and 2 hours after breakfast, Lunch and Dinner   loratadine 10 MG tablet Commonly known as: CLARITIN Take 10 mg by mouth daily.   ondansetron 4 MG disintegrating tablet Commonly known as: Zofran ODT Take 1 tablet (4 mg total) by mouth every 8 (eight) hours as needed for nausea or vomiting.   prenatal vitamin w/FE, FA 27-1 MG Tabs tablet Take 1 tablet by mouth daily at 12 noon.   promethazine 25 MG tablet Commonly known as: PHENERGAN Take 1 tablet (25 mg total) by mouth every 6 (six) hours as needed for nausea or vomiting.   sennosides-docusate sodium 8.6-50 MG tablet Commonly known as: SENOKOT-S Take 1 tablet by mouth daily.       Katrinka Blazing, [place], CNM 08/11/2019 4:10 PM  3

## 2019-08-11 NOTE — Discharge Instructions (Signed)
[email or web address]  Using a massage Ball you can trigger the muscle even more precisely. Be careful when applying pressure to not overdo it.  How to do it: - Begin in a seated position on the ground with your knees bent. - Position a massage ball under your right buttock (affected area) and rest your hands on the ground behind your back. - Cross the other leg (left) on top of the right leg. - Find a tight spot and hold it for 30-60 seconds. - Move on to the next tight spot.       Braxton Hicks Contractions Contractions of the uterus can occur throughout pregnancy, but they are not always a sign that you are in labor. You may have practice contractions called Braxton Hicks contractions. These false labor contractions are sometimes confused with true labor. What are Deberah Pelton contractions? Braxton Hicks contractions are tightening movements that occur in the muscles of the uterus before labor. Unlike true labor contractions, these contractions do not result in opening (dilation) and thinning of the cervix. Toward the end of pregnancy (32-34 weeks), Braxton Hicks contractions can happen more often and may become stronger. These contractions are sometimes difficult to tell apart from true labor because they can be very uncomfortable. You should not feel embarrassed if you go to the hospital with false labor. Sometimes, the only way to tell if you are in true labor is for your health care provider to look for changes in the cervix. The health care provider will do a physical exam and may monitor your contractions. If you are not in true labor, the exam should show that your cervix is not dilating and your water has not broken. If there are no other health problems associated with your pregnancy, it is completely safe for you to be sent home with false labor. You may continue to have Braxton Hicks contractions until you go into true labor. How to tell the  difference between true labor and false labor True labor  Contractions last 30-70 seconds.  Contractions become very regular.  Discomfort is usually felt in the top of the uterus, and it spreads to the lower abdomen and low back.  Contractions do not go away with walking.  Contractions usually become more intense and increase in frequency.  The cervix dilates and gets thinner. False labor  Contractions are usually shorter and not as strong as true labor contractions.  Contractions are usually irregular.  Contractions are often felt in the front of the lower abdomen and in the groin.  Contractions may go away when you walk around or change positions while lying down.  Contractions get weaker and are shorter-lasting as time goes on.  The cervix usually does not dilate or become thin. Follow these instructions at home:   Take over-the-counter and prescription medicines only as told by your health care provider.  Keep up with your usual exercises and follow other instructions from your health care provider.  Eat and drink lightly if you think you are going into labor.  If Braxton Hicks contractions are making you uncomfortable: ? Change your position from lying down or resting to walking, or change from walking to resting. ? Sit and rest in a tub of warm water. ? Drink enough fluid to keep your urine pale yellow. Dehydration may cause these contractions. ? Do slow and deep breathing several times an hour.  Keep all follow-up prenatal visits as told by your health care provider. This is important. Contact a health care  provider if:  You have a fever.  You have continuous pain in your abdomen. Get help right away if:  Your contractions become stronger, more regular, and closer together.  You have fluid leaking or gushing from your vagina.  You pass blood-tinged mucus (bloody show).  You have bleeding from your vagina.  You have low back pain that you never had  before.  You feel your babys head pushing down and causing pelvic pressure.  Your baby is not moving inside you as much as it used to. Summary  Contractions that occur before labor are called Braxton Hicks contractions, false labor, or practice contractions.  Braxton Hicks contractions are usually shorter, weaker, farther apart, and less regular than true labor contractions. True labor contractions usually become progressively stronger and regular, and they become more frequent.  Manage discomfort from [facility] contractions by changing position, resting in a warm bath, drinking plenty of water, or practicing deep breathing. This information is not intended to replace advice given to you by your health care provider. Make sure you discuss any questions you have with your health care provider. Document Released: 02/02/2017 Document Revised: 09/01/2017 Document Reviewed: 02/02/2017 Elsevier Patient Education  2020 [employer].

## 2019-08-11 NOTE — MAU Note (Signed)
Caroline Gaines is a 28 y.o. at [redacted]w[redacted]d here in MAU reporting: having really bad cramping for a week and states the baby is balled up on the right side and is having pain in that right leg, thinks it is a pinched nerve. Cramping is intermittent but doesn't think it is contractions. No bleeding, LOF, or discharge. Decreased fetal movement.   Onset of complaint: ongoing  Pain score: cramping 8/10, leg pain 10/10  Vitals:   08/11/19 1421  BP: 121/74  Pulse: 97  Resp: 14  Temp: 98.2 F (36.8 C)  SpO2: 100%     FHT: 142  Lab orders placed from triage: UA

## 2019-08-12 ENCOUNTER — Other Ambulatory Visit: Payer: Self-pay

## 2019-08-12 DIAGNOSIS — M5431 Sciatica, right side: Secondary | ICD-10-CM

## 2019-08-12 DIAGNOSIS — O0990 Supervision of high risk pregnancy, unspecified, unspecified trimester: Secondary | ICD-10-CM

## 2019-08-12 LAB — CULTURE, OB URINE
Culture: NO GROWTH
Special Requests: NORMAL

## 2019-08-13 ENCOUNTER — Ambulatory Visit: Payer: BC Managed Care – PPO | Attending: Obstetrics and Gynecology

## 2019-08-13 ENCOUNTER — Other Ambulatory Visit: Payer: Self-pay

## 2019-08-13 DIAGNOSIS — M6281 Muscle weakness (generalized): Secondary | ICD-10-CM | POA: Insufficient documentation

## 2019-08-13 DIAGNOSIS — M5441 Lumbago with sciatica, right side: Secondary | ICD-10-CM | POA: Insufficient documentation

## 2019-08-13 NOTE — Therapy (Signed)
[facility] [address], [place], [place], [address] Phone: [phone]   Fax:  [phone]  Physical Therapy Evaluation  Patient Details  Name: Caroline Gaines MRN: [id number] Date of Birth: 1991/07/27 Referring Provider (PT): Arlina Robes, MD   Encounter Date: 08/13/2019  PT End of Session - 08/13/19 1007    Visit Number  1    Date for PT Re-Evaluation  09/24/19    Authorization Type  Medicaid    PT Start Time  0935    PT Stop Time  1008    PT Time Calculation (min)  33 min    Activity Tolerance  Patient tolerated treatment well    Behavior During Therapy  [facility] for tasks assessed/performed       Past Medical History:  Diagnosis Date  . Anxiety   . Asthma   . Depression   . Endometriosis   . Polysubstance abuse ([place])   . Seizures ([place])   . SMOKER 11/02/2009   Qualifier: Diagnosis of  By: Melvyn Novas MD, Christena Deem     Past Surgical History:  Procedure Laterality Date  . DIAGNOSTIC LAPAROSCOPY  2012   In [place], endometriosis diagnosed and lesions removed    There were no vitals filed for this visit.   Subjective Assessment - 08/13/19 0940    Subjective  Pt presents to PT with Rt sided LE pain that began 2-3 months ago related to pregnancy.  Pt is now [redacted] weeks pregnant and pain worsened over the pas week.    Pertinent History  [redacted] weeks pregnant.    Limitations  Sitting;Walking    How long can you sit comfortably?  30 minutes - 1 hour    How long can you walk comfortably?  short distances < 5 minutes    Diagnostic tests  none    Patient Stated Goals  reduce Rt LE pain, reduce LBP, sleep without interruption    Currently in Pain?  Yes    Pain Score  8    up to 9-10/10   Pain Location  Back    Pain Orientation  Right    Pain Descriptors / Indicators  Aching;Sore;Shooting    Pain Type  Acute pain    Pain Onset  More than a month ago    Pain Frequency  Occasional    Aggravating Factors   sleep at  night, sitting, walking    Pain Relieving Factors  Flexeril (makes me sleep), change of position         [facility] PT Assessment - 08/13/19 0001      Assessment   Medical Diagnosis  chronic sciatica, Rt    Referring Provider (PT)  Arlina Robes, MD    Onset Date/Surgical Date  06/13/19    Next MD Visit  1 week    Prior Therapy  none      Precautions   Precautions  Other (comment)    Precaution Comments  Pt is [redacted] weeks pregnant      Restrictions   Weight Bearing Restrictions  No      Balance Screen   Has the patient fallen in the past 6 months  No    Has the patient had a decrease in activity level because of a fear of falling?   No    Is the patient reluctant to leave their home because of a fear of falling?   No      Home Film/video editor residence  Living Arrangements  Spouse/significant other    Type of Home  Apartment    Home Access  Level entry    Home Layout  One level      Prior Function   Level of Independence  Independent    Vocation  Full time employment    Vocation Requirements  desk work    Leisure  walking (for exercise)- not able to do this now      Cognition   Overall Cognitive Status  Within Functional Limits for tasks assessed      Posture/Postural Control   Posture/Postural Control  Postural limitations    Postural Limitations  Rounded Shoulders;Increased lumbar lordosis      ROM / Strength   AROM / PROM / Strength  AROM;PROM;Strength      AROM   Overall AROM   Within functional limits for tasks performed    Overall AROM Comments  lumbar and hip A/ROM are full with pain in the lumbar spine at end range.  Hamstring stiffness reported with end range hip flexion (SLR)      PROM   Overall PROM   Within functional limits for tasks performed    Overall PROM Comments  stifness reported at end range hip P/ROM      Strength   Overall Strength  Deficits    Overall Strength Comments  Lt LE 5/5    Strength Assessment Site   Knee;Hip;Ankle    Right/Left Hip  Right    Right Hip Flexion  4-/5    Right Hip External Rotation   4/5    Right Hip Internal Rotation  4/5    Right/Left Knee  Right    Right Knee Flexion  4+/5    Right Knee Extension  4+/5    Right/Left Ankle  Right    Right Ankle Dorsiflexion  5/5      Palpation   Palpation comment  Pt with palpable tenderness over Rt lumbar paraspinals, quadratus, proximal gluteals and hip flexors      Ambulation/Gait   Ambulation/Gait  Yes    Gait Pattern  Step-through pattern;Decreased stance time - right                Objective measurements completed on examination: See above findings.              PT Education - 08/13/19 1006    Education Details  Access Code: [email or web address]    Person(s) Educated  Patient    Methods  Explanation;Demonstration    Comprehension  Verbalized understanding;Returned demonstration       PT Short Term Goals - 08/13/19 1012      PT SHORT TERM GOAL #1   Title  be independent in initial HEP    Time  3    Period  Weeks    Status  New    Target Date  09/03/19      PT SHORT TERM GOAL #2   Title  verbalize and demonstrate body mechanics modification for home and work tasks for lumbar protection    Time  3    Period  Weeks    Status  New    Target Date  09/03/19      PT SHORT TERM GOAL #3   Title  report a 25% reduction in LBP and Rt LE pain with home and work tasks    Time  3    Period  Weeks    Status  New    Target Date  09/03/19  PT Long Term Goals - 08/13/19 1013      PT LONG TERM GOAL #1   Title  be independent in advanced HEP    Time  6    Period  Weeks    Status  New    Target Date  09/24/19      PT LONG TERM GOAL #2   Title  reduce LBP and Rt LE pain to walk for 20-30 minutes without limitation    Time  6    Period  Weeks    Status  New    Target Date  09/24/19      PT LONG TERM GOAL #3   Title  report < or = to 4/10 LBP and Rt LE pain with home and work tasks    Time  6     Period  Weeks    Status  New    Target Date  09/24/19      PT LONG TERM GOAL #4   Title  demonstrate 5/5 Rt hip and knee strength to improve endurance and safety with functional mobility    Time  6    Period  Weeks    Target Date  09/24/19      PT LONG TERM GOAL #5   Title  sleep with 50% fewer sleep interruptions due to pain    Time  6    Period  Weeks    Status  New    Target Date  09/24/19             Plan - 08/13/19 1119    Clinical Impression Statement  PT is [redacted] weeks pregnant and presents with onset of Rt LE pain that began 2-3 months ago.   Pt rates pain in the low back and Rt LE pain as 8/10.  Pt is waking 4-5x/night with pain and need to change position.  Pt is not able to walk for exercise due to inability to walk >5 minutes due to pain.  Pt demonstrates mild antalgia with gait and has 4/5 Rt hip strength and 4+/5 Rt knee strength.  Pt with palpable tenderness over Rt lumbar paraspinals, quadratus, gluteals and hip flexors.  Pt will benefit from skilled PT to reduce pain, improve Rt LE strength, gait and improve functional mobility.    Personal Factors and Comorbidities  Comorbidity 1    Comorbidities  Pt is pregnant    Examination-Activity Limitations  Locomotion Level;Sleep;Stand    Stability/Clinical Decision Making  Stable/Uncomplicated    Clinical Decision Making  Low    Rehab Potential  Good    PT Frequency  2x / week    PT Duration  6 weeks    PT Treatment/Interventions  ADLs/Self Care Home Management;Cryotherapy;Electrical Stimulation;Moist Heat;Therapeutic exercise;Therapeutic activities;Functional mobility training;Gait training;Neuromuscular re-education;Patient/family education;Passive range of motion;Manual techniques;Taping    PT Next Visit Plan  flexibility (review HEP issued at evaluation), manual to Rt lumbar spine and gluteals, body mechanics education. core and hip strength.  Nerve glides/flossing    PT Home Exercise Plan  Access Code: [email or web address]     Consulted and Agree with Plan of Care  Patient       Patient will benefit from skilled therapeutic intervention in order to improve the following deficits and impairments:  Abnormal gait, Decreased activity tolerance, Decreased mobility, Decreased range of motion, Decreased strength, Difficulty walking, Increased muscle spasms, Impaired flexibility, Improper body mechanics, Pain  Visit Diagnosis: Acute right-sided low back pain with right-sided sciatica - Plan: PT plan of  care cert/re-cert  Muscle weakness (generalized) - Plan: PT plan of care cert/re-cert     Problem List Patient Active Problem List   Diagnosis Date Noted  . Gestational diabetes 07/03/2019  . Loud snoring 06/04/2019  . Family history of alpha thalassemia 06/04/2019  . Alpha thalassemia silent carrier 06/04/2019  . Supervision of high risk pregnancy, antepartum 03/25/2019  . Major depressive disorder, recurrent, severe without psychotic features (HCC)   . Major depressive disorder, recurrent episode, severe (HCC) 04/18/2015  . Generalized anxiety disorder 04/18/2015  . ASTHMA 11/02/2009  . HEADACHE, CHRONIC 11/02/2009     Lorrene Reid, PT 08/13/19 11:27 AM  [facility] [address] [place], [place], [address] Phone: [phone]   Fax:  [phone]  Name: Adelise Buswell MRN: [id number] Date of Birth: 21-May-1991

## 2019-08-13 NOTE — Patient Instructions (Signed)
Access Code: [email or web address]  URL: [email or web address]  Date: 08/13/2019  Prepared by: Sigurd Sos   Exercises Supine Lower Trunk Rotation - 3 reps - 1 sets - 20 hold - 3x daily - 7x weekly Seated Hamstring Stretch - 3 reps - 1 sets - 20 hold - 3x daily - 7x weekly Sidelying Open Book Thoracic Lumbar Rotation and Extension - 10 reps - 10 sets - 10 hold - 3x daily - 7x weekly Modified Thomas Stretch - 3 reps - 20 hold - 3x daily - 7x weekly

## 2019-08-16 ENCOUNTER — Encounter: Payer: Self-pay | Admitting: Family Medicine

## 2019-08-20 ENCOUNTER — Encounter: Payer: Self-pay | Admitting: Physical Therapy

## 2019-08-20 ENCOUNTER — Ambulatory Visit: Payer: BC Managed Care – PPO | Admitting: Physical Therapy

## 2019-08-20 ENCOUNTER — Other Ambulatory Visit: Payer: Self-pay

## 2019-08-20 DIAGNOSIS — M5441 Lumbago with sciatica, right side: Secondary | ICD-10-CM

## 2019-08-20 DIAGNOSIS — M6281 Muscle weakness (generalized): Secondary | ICD-10-CM

## 2019-08-20 NOTE — Therapy (Addendum)
[facility] [address], [place], [place], [address] Phone: [phone]   Fax:  [phone]  Physical Therapy Treatment  Patient Details  Name: Caroline Gaines MRN: [id number] Date of Birth: 1991/08/21 Referring Provider (PT): Arlina Robes, MD   Encounter Date: 08/20/2019  PT End of Session - 08/20/19 1656    Visit Number  2    Date for PT Re-Evaluation  09/24/19    Authorization Type  Medicaid    PT Start Time  1438    PT Stop Time  1655    PT Time Calculation (min)  38 min    Activity Tolerance  Patient tolerated treatment well;No increased pain    Behavior During Therapy  WFL for tasks assessed/performed       Past Medical History:  Diagnosis Date  . Anxiety   . Asthma   . Depression   . Endometriosis   . Polysubstance abuse ([place])   . Seizures ([place])   . SMOKER 11/02/2009   Qualifier: Diagnosis of  By: Melvyn Novas MD, Christena Deem     Past Surgical History:  Procedure Laterality Date  . DIAGNOSTIC LAPAROSCOPY  2012   In [place], endometriosis diagnosed and lesions removed    There were no vitals filed for this visit.  Subjective Assessment - 08/20/19 1622    Subjective  Pt has been working on her stretching and this has helped her Rt leg pain. Pt states that she now has Rt anterior knee pain and low back pain.    Pertinent History  [redacted] weeks pregnant.    Limitations  Sitting;Walking    How long can you sit comfortably?  30 minutes - 1 hour    How long can you walk comfortably?  short distances < 5 minutes    Diagnostic tests  none    Patient Stated Goals  reduce Rt LE pain, reduce LBP, sleep without interruption    Currently in Pain?  No/denies    Pain Onset  More than a month ago                       [facility] Adult PT Treatment/Exercise - 08/20/19 0001      Exercises   Exercises  Lumbar      Lumbar Exercises: Stretches   Hip Flexor Stretch  Right;2 reps;20 seconds    Hip Flexor  Stretch Limitations  standing with LE in chair     Piriformis Stretch  Right;Left;2 reps;20 seconds      Lumbar Exercises: Standing   Other Standing Lumbar Exercises  squat with row #20 x15 reps       Lumbar Exercises: Seated   Other Seated Lumbar Exercises  long seated 90/90 hip IR/ER stretch side to side x20 reps     Other Seated Lumbar Exercises  clamshell x15 reps yellow TB       Lumbar Exercises: Sidelying   Other Sidelying Lumbar Exercises  thoracic rotation open book stretch x15 reps              PT Education - 08/20/19 1655    Education Details  updates to HEP    Person(s) Educated  Patient    Methods  Explanation;Handout    Comprehension  Verbalized understanding       PT Short Term Goals - 08/13/19 1012      PT SHORT TERM GOAL #1   Title  be independent in initial HEP    Time  3  Period  Weeks    Status  New    Target Date  09/03/19      PT SHORT TERM GOAL #2   Title  verbalize and demonstrate body mechanics modification for home and work tasks for lumbar protection    Time  3    Period  Weeks    Status  New    Target Date  09/03/19      PT SHORT TERM GOAL #3   Title  report a 25% reduction in LBP and Rt LE pain with home and work tasks    Time  3    Period  Weeks    Status  New    Target Date  09/03/19        PT Long Term Goals - 08/13/19 1013      PT LONG TERM GOAL #1   Title  be independent in advanced HEP    Time  6    Period  Weeks    Status  New    Target Date  09/24/19      PT LONG TERM GOAL #2   Title  reduce LBP and Rt LE pain to walk for 20-30 minutes without limitation    Time  6    Period  Weeks    Status  New    Target Date  09/24/19      PT LONG TERM GOAL #3   Title  report < or = to 4/10 LBP and Rt LE pain with home and work tasks    Time  6    Period  Weeks    Status  New    Target Date  09/24/19      PT LONG TERM GOAL #4   Title  demonstrate 5/5 Rt hip and knee strength to improve endurance and safety with  functional mobility    Time  6    Period  Weeks    Target Date  09/24/19      PT LONG TERM GOAL #5   Title  sleep with 50% fewer sleep interruptions due to pain    Time  6    Period  Weeks    Status  New    Target Date  09/24/19            Plan - 08/20/19 1656    Clinical Impression Statement  Pt is doing well with her HEP, noting resolved Rt LE pain since her evaluation. She does continue to have low back pain throughout the day. Session focused on making some adjustments to supine therex in order to improve comfort. Also addressed hip ROM limitations. Pt felt good relief in the Rt hip with internal rotation/external rotation stretching completed today. Also introduced some gentle trunk/LE strengthening. Pt denied increase in pain end of session and demonstrated understanding of HEP updates.    Personal Factors and Comorbidities  Comorbidity 1    Comorbidities  Pt is pregnant    Examination-Activity Limitations  Locomotion Level;Sleep;Stand    Stability/Clinical Decision Making  Stable/Uncomplicated    Rehab Potential  Good    PT Frequency  2x / week    PT Duration  6 weeks    PT Treatment/Interventions  ADLs/Self Care Home Management;Cryotherapy;Electrical Stimulation;Moist Heat;Therapeutic exercise;Therapeutic activities;Functional mobility training;Gait training;Neuromuscular re-education;Patient/family education;Passive range of motion;Manual techniques;Taping    PT Next Visit Plan  hip flexibility; body mechanics education. core and hip strength.    PT Home Exercise Plan  Access Code: [email or web address]    [email or web address]  and Agree with Plan of Care  Patient       Patient will benefit from skilled therapeutic intervention in order to improve the following deficits and impairments:  Abnormal gait, Decreased activity tolerance, Decreased mobility, Decreased range of motion, Decreased strength, Difficulty walking, Increased muscle spasms, Impaired flexibility, Improper body mechanics,  Pain  Visit Diagnosis: Acute right-sided low back pain with right-sided sciatica  Muscle weakness (generalized)     Problem List Patient Active Problem List   Diagnosis Date Noted  . Gestational diabetes 07/03/2019  . Loud snoring 06/04/2019  . Family history of alpha thalassemia 06/04/2019  . Alpha thalassemia silent carrier 06/04/2019  . Supervision of high risk pregnancy, antepartum 03/25/2019  . Major depressive disorder, recurrent, severe without psychotic features ([place])   . Major depressive disorder, recurrent episode, severe ([place]) 04/18/2015  . Generalized anxiety disorder 04/18/2015  . ASTHMA 11/02/2009  . HEADACHE, CHRONIC 11/02/2009   5:01 PM,08/20/19 Sherol Dade PT, DPT [facility] PHYSICAL THERAPY DISCHARGE SUMMARY  Visits from Start of Care: 2   Current functional level related to goals / functional outcomes: Pt will be induced for delivery of her baby and will D/C at this time.     Remaining deficits: See above for most current PT status.     Education / Equipment: HEP, posture/mechanics Plan: Patient agrees to discharge.  Patient goals were partially met. Patient is being discharged due to a change in medical status.  ?????        Sigurd Sos, PT 09/02/19 11:14 AM  [facility] [address], [place], [place], [address] Phone: [phone]   Fax:  [phone]  Name: Caroline Gaines MRN: [id number] Date of Birth: [birth year]

## 2019-08-20 NOTE — Patient Instructions (Signed)
Access Code: [email or web address]  URL: [email or web address]  Date: 08/20/2019  Prepared by: Sherol Dade   Exercises  Seated Hamstring Stretch - 3 reps - 1 sets - 20 hold - 3x daily - 7x weekly  Sidelying Open Book Thoracic Lumbar Rotation and Extension - 10 reps - 10 sets - 10 hold - 3x daily - 7x weekly  Modified Thomas Stretch - 3 reps - 20 hold - 2x daily - 7x weekly  Quadriceps Stretch with Table - 3 reps - 20 hold - 2x daily - 7x weekly  Seated Piriformis Stretch - 3 reps - 20 hold - 2x daily - 7x weekly    [facility] [address] [place], [place] [address] Phone # [phone] Fax [phone]

## 2019-08-21 ENCOUNTER — Encounter: Payer: Self-pay | Admitting: Family Medicine

## 2019-08-21 ENCOUNTER — Other Ambulatory Visit: Payer: Self-pay

## 2019-08-21 ENCOUNTER — Ambulatory Visit: Payer: Self-pay

## 2019-08-21 ENCOUNTER — Ambulatory Visit (INDEPENDENT_AMBULATORY_CARE_PROVIDER_SITE_OTHER): Payer: BC Managed Care – PPO | Admitting: Obstetrics & Gynecology

## 2019-08-21 VITALS — BP 125/68 | HR 89 | Wt 193.7 lb

## 2019-08-21 DIAGNOSIS — O24410 Gestational diabetes mellitus in pregnancy, diet controlled: Secondary | ICD-10-CM

## 2019-08-21 DIAGNOSIS — O0990 Supervision of high risk pregnancy, unspecified, unspecified trimester: Secondary | ICD-10-CM

## 2019-08-21 DIAGNOSIS — O0993 Supervision of high risk pregnancy, unspecified, third trimester: Secondary | ICD-10-CM | POA: Diagnosis not present

## 2019-08-21 DIAGNOSIS — Z3A35 35 weeks gestation of pregnancy: Secondary | ICD-10-CM | POA: Diagnosis not present

## 2019-08-21 MED ORDER — METFORMIN HCL 500 MG PO TABS: ORAL_TABLET | ORAL | 2 refills | Status: DC

## 2019-08-21 NOTE — Addendum Note (Signed)
Addended by: Langston Reusing on: 08/21/2019 02:53 PM   Modules accepted: Orders

## 2019-08-21 NOTE — Progress Notes (Signed)
   PRENATAL VISIT NOTE  Subjective:  Caroline Gaines is a 28 y.o. G1P0000 at [redacted]w[redacted]d being seen today for ongoing prenatal care.  She is currently monitored for the following issues for this high-risk pregnancy and has ASTHMA; HEADACHE, CHRONIC; Major depressive disorder, recurrent episode, severe ([place]); Generalized anxiety disorder; Major depressive disorder, recurrent, severe without psychotic features (Caroline Gaines); Supervision of high risk pregnancy, antepartum; Loud snoring; Family history of alpha thalassemia; Alpha thalassemia silent carrier; and Gestational diabetes on their problem list.  Patient reports no complaints.  Contractions: Irritability. Vag. Bleeding: None.  Movement: Present. Denies leaking of fluid.   The following portions of the patient's history were reviewed and updated as appropriate: allergies, current medications, past family history, past medical history, past social history, past surgical history and problem list.   Objective:   Vitals:   08/21/19 1418  BP: 125/68  Pulse: 89  Weight: 193 lb 11.2 oz (87.9 kg)    Fetal Status: Fetal Heart Rate (bpm): 139   Movement: Present     General:  Alert, oriented and cooperative. Patient is in no acute distress.  Skin: Skin is warm and dry. No rash noted.   Cardiovascular: Normal heart rate noted  Respiratory: Normal respiratory effort, no problems with respiration noted  Abdomen: Soft, gravid, appropriate for gestational age.  Pain/Pressure: Present     Pelvic: Cervical exam deferred        Extremities: Normal range of motion.  Edema: Trace  Mental Status: Normal mood and affect. Normal behavior. Normal judgment and thought content.   Assessment and Plan:  Pregnancy: G1P0000 at [redacted]w[redacted]d 1. Supervision of high risk pregnancy, antepartum  2. Gestational diabetes mellitus (GDM) in third trimester - she takes as many CBGs as she can but admits that she is not doing it enough - fastings are all out of range, usually more than  100 - metformin 500 mg qhs - start BPPs weekly now - she is aware that we will rec IOL at 39 weeks - rec cervical cultures at next week  Preterm labor symptoms and general obstetric precautions including but not limited to vaginal bleeding, contractions, leaking of fluid and fetal movement were reviewed in detail with the patient. Please refer to After Visit Summary for other counseling recommendations.   Return for weekly at office with BPP.  Future Appointments  Date Time Provider [place]  08/28/2019 12:30 PM Sherol Dade, [place] [facility]  09/03/2019 12:30 PM Danie Binder, PT [facility]  09/06/2019  8:15 AM Chancy Milroy, MD [facility]  09/11/2019 12:30 PM Danie Binder, PT [facility]  09/13/2019  8:15 AM Chancy Milroy, MD [facility]  09/18/2019 12:30 PM Danie Binder, PT [facility]  09/20/2019  8:15 AM Chancy Milroy, MD [facility]  09/25/2019  8:15 AM Donnamae Jude, MD [facility]  09/30/2019  8:15 AM Donnamae Jude, MD [facility]    Emily Filbert, MD

## 2019-08-25 DIAGNOSIS — Z029 Encounter for administrative examinations, unspecified: Secondary | ICD-10-CM

## 2019-08-26 ENCOUNTER — Other Ambulatory Visit: Payer: Self-pay

## 2019-08-26 ENCOUNTER — Ambulatory Visit (INDEPENDENT_AMBULATORY_CARE_PROVIDER_SITE_OTHER): Payer: Medicaid Other | Admitting: Student

## 2019-08-26 ENCOUNTER — Ambulatory Visit: Payer: Self-pay

## 2019-08-26 ENCOUNTER — Other Ambulatory Visit (HOSPITAL_COMMUNITY)
Admission: RE | Admit: 2019-08-26 | Discharge: 2019-08-26 | Disposition: A | Discharge status: Home / Self Care | Payer: BC Managed Care – PPO | Source: Ambulatory Visit | Attending: Student | Admitting: Student

## 2019-08-26 ENCOUNTER — Ambulatory Visit (INDEPENDENT_AMBULATORY_CARE_PROVIDER_SITE_OTHER): Payer: BC Managed Care – PPO | Admitting: *Deleted

## 2019-08-26 ENCOUNTER — Telehealth: Payer: Self-pay

## 2019-08-26 ENCOUNTER — Encounter: Payer: Self-pay | Admitting: Student

## 2019-08-26 VITALS — BP 117/73 | HR 89 | Wt 193.3 lb

## 2019-08-26 DIAGNOSIS — O0990 Supervision of high risk pregnancy, unspecified, unspecified trimester: Secondary | ICD-10-CM | POA: Diagnosis present

## 2019-08-26 DIAGNOSIS — O24415 Gestational diabetes mellitus in pregnancy, controlled by oral hypoglycemic drugs: Secondary | ICD-10-CM | POA: Diagnosis not present

## 2019-08-26 DIAGNOSIS — Z3A35 35 weeks gestation of pregnancy: Secondary | ICD-10-CM

## 2019-08-26 DIAGNOSIS — O0993 Supervision of high risk pregnancy, unspecified, third trimester: Secondary | ICD-10-CM

## 2019-08-26 MED ORDER — METFORMIN HCL 500 MG PO TABS: ORAL_TABLET | ORAL | 2 refills | Status: DC

## 2019-08-26 NOTE — Patient Instructions (Signed)
AREA PEDIATRIC/FAMILY PRACTICE PHYSICIANS  [place] ([address]) . [facility] o Chambliss, MD; Eniola, MD; Hale, MD; Hensel, MD; McDiarmid, MD; McIntyer, MD; Neal, MD; Walden, MD o [address] [place], [place] [address] o [phone] o Mon-Fri 8:30-12:30, 1:30-5:00 o Providers come to see babies at [facility] o Accepting Medicaid . [facility] o Limited providers who accept newborns: Koirala, MD; Morrow, MD; Wolters, MD o [address], [place], [place] [address] o [phone] o Mon-Fri 8:00-5:30 o Babies seen by providers at [facility] o Does NOT accept Medicaid o Please call early in hospitalization for appointment (limited availability)  . [facility] o Mulberry, MD o [address] [place], [place] [address] o [phone] o Mon, Tue, Thur, Fri 8:30-5:00, Wed 10:00-7:00 (closed 1-2pm) o Babies seen by [facility] providers o Accepting Medicaid . [facility] o Rubin, MD o [address], [place], [place] [address] o [phone] o Mon-Fri 8:30-5:00, Sat 8:30-12:00 o Provider comes to see babies at [facility] o Accepting Medicaid o Must have been referred from current patients or contacted office prior to delivery . [facility] ([facility]) o Brown, MD; Chandler, MD; Ettefagh, MD; Grant, MD; Lester, MD; McCormick, MD; McQueen, MD; Prose, MD; Simha, MD; Stanley, MD; Stryffeler, NP; Tebben, NP o [address], [place], [place] [address] o [phone] o Mon, Tue, Thur, Fri 8:30-5:30, Wed 9:30-5:30, Sat 8:30-12:30 o Babies seen by [facility] providers o Accepting Medicaid o Only accepting infants of first-time parents or siblings of current patients o Hospital discharge coordinator will make follow-up appointment . Jack Amos o [address],  [place], [place]  [address] o [phone]   Fax - [phone] . [facility] o [address], [place], [place]  [address] o Phone - [phone]   Fax - [phone] . Shilpa Gosrani o [address], [place], [place]  [address] o [phone]  [place] ([address]) . [facility] o Bates, MD; Brassfield, MD; Cooper, Cox, MD; MD; Davis, MD; Dovico, MD; Ettefaugh, MD; Little, MD; Lowe, MD; Keiffer, MD; Melvin, MD; Sumner, MD; Williams, MD o [address], [place], [place] [address] o [phone] o Mon-Fri 8:30-5:00 (extended evenings Mon-Thur as needed), Sat-Sun 10:00-1:00 o Providers come to see babies at [facility] o Accepting Medicaid for families of first-time babies and families with all children in the household age 3 and under. Must register with office prior to making appointment (M-F only). . [facility] o Henson, NP; Knapp, MD; Lalonde, MD; Tysinger, PA o [address] [place], [place] [address] o [phone] o Mon-Fri 8:00-5:00 o Babies seen by providers at [facility] o Does NOT accept Medicaid/Commercial Insurance Only . [facility] ([facility]  o Artis, MD; Barnes, MD; Bratton, MD; Coccaro, MD; Lockett Gardner, MD; Kramer, MD; Marshall, MD; Netherton, MD; Poleto, MD; Skinner, MD o [address] [place], [place] [address] o [phone] o Mon-Fri 8:30-5:30, Sat (Oct.-Mar.) 9:00-1:00 o Babies seen by providers at [facility] o Accepting Medicaid  [place] ([address]) . [facility] o Reid, MD; Warner, MD o [address], , [place] [address] o [phone] o Mon-Fri 8:30-5:00, Sat 8:30-12:00 o Providers come to see babies at [facility] o Does NOT accept Medicaid . [facility] o Becker, PA; Hagler, MD; Scifres, PA; Sun, MD; Swayne, MD o [address],  [place], [place] [address] o [phone] o Mon-Fri 8:00-5:00 o Babies seen by providers at [facility] o Does NOT accept Medicaid o Only accepting babies of parents who are patients o Please call early in hospitalization for appointment (limited availability) . [facility] o Clark, MD; Frye, MD; Kelleher, MD; Mack, NP; Miller, MD; O'Keller, MD; Patterson, NP; Pudlo, MD; Puzio, MD; Thomas, MD; Tucker, MD; Twiselton, MD o [address], [place], [place] [address] o [phone] o Mon-Fri 8:00-5:00, Sat 9:00-12:00 o Providers come to see babies at [facility] o Does NOT accept Medicaid  [place] ([address]) . [facility] o Limited providers accepting new patients: Brake, NP; Wharton, PA o [address], [place], [place] [address] o [phone] o Mon-Fri 8:00-5:00 o Babies seen by providers at [facility] o Does NOT accept Medicaid o Only accepting babies of parents who are patients o Please call early in hospitalization for appointment (limited availability) . [facility] o Gay, MD; Quinlan, MD o [address] [place], [place] [address] o [phone] (press [phone] to schedule appointment) o Mon-Fri 8:00-5:00 o Providers come to see babies at [facility] o Does NOT accept Medicaid . [facility] o Mazer, MD o [address] [place], [place] [address] o [phone] o Mon-Fri 8:30-5:00 (lunch 12:30-1:00), extended hours by appointment only Wed 5:00-6:30 o Babies seen by [facility] providers o Accepting Medicaid . [facility] o Banks, MD; Jordan, MD; Koberlein, MD o [address], [place], [place] [address] o [phone] o Mon-Fri 8:00-5:00 o Babies seen by [facility] providers o Does NOT accept Medicaid . [facility] o Parker, MD; Hunter, MD; Wallace, DO o [address] [place], [place]  [address] o [phone] o Mon-Fri 8:00-5:00 o Babies seen by [facility] providers o Does NOT accept Medicaid . [facility] o Brandon, PA; Brecken, PA; Christy, NP; Dees, MD; DeClaire, MD; DeWeese, MD; Hansen, NP; Mills, NP; Parrish, NP; Smoot, NP; Summer, MD; Vapne, MD o [address] [place], [place] [address] o [phone] o Mon-Fri 8:30-5:00, Sat 10:00-1:00 o Providers come to see babies at [facility] o Does NOT accept Medicaid o Free prenatal information session Tuesdays at 4:45pm . [facility] o Bouska, MD; Gordon, PA; Jeffery, PA; Weber, PA o [address] [place] [address] o [phone] o Mon-Fri 7:30-5:30 o Babies seen by [facility] providers . [facility] o [address], [place], [place]  [address] o [phone]   Fax - [phone]  [place] ([address] & [address]) . [facility] o [place]ese, MD o [address] [place], [place] [address] o [phone] o Mon-Thur 8:00-6:00 o Providers come to see babies at [facility] o Accepting Medicaid . [facility] o Anderson, NP; Badger, MD; Beal, PA; Spencer, PA o [address] [place], [place] [address] o [phone] o Mon-Thur 7:30-7:30, Fri 7:30-4:30 o Babies seen by [facility] providers o Accepting Medicaid . [facility] o Agbuya, MD; Klett, NP; Romgoolam, MD o [address], , [place] [address] o [phone] o Mon-Fri 8:30-5:00, Sat 8:30-12:00 o Providers come to see babies at [facility] o Accepting Medicaid o Must have "Meet & Greet" appointment at office prior to delivery . [facility] o McCord,   MD; Wallace, MD; Wood, MD o [address], [place], [place] [address] o [phone] o Mon-Wed 8:00-6:00, Thur-Fri 8:00-5:00, Sat 9:00-12:00 o Providers come to  see babies at [facility] o Does NOT accept Medicaid o Only accepting siblings of current patients . [facility]  o [address], [place], [place]  [address] o [phone]   Fax - [phone] . [facility] o [address], [place], [place]  [address] o [phone]   Fax - [phone]  [place] ([address] & [address]) . [facility] o Cirigliano, DO; Matthews, DO o [address] [place], [place] [address] o [phone] o Mon-Fri 7:00-5:00 o Babies seen by [facility] providers o Does NOT accept Medicaid . [facility] o Briscoe, MD; Howley, PA; Moreira, PA o [address], [place], [place] [address] o [phone] o Mon-Fri 8:00-5:00 o Babies seen by [facility] providers o Accepting Medicaid . [facility] o Boyd, MD; Church, PA; Jones, NP; Osborn, PA o [address], [place], [place] [address] o [phone] o Mon-Fri 8:00-5:00 o Babies seen by providers at [facility] o Accepting Medicaid  [place] ([address]) . [facility] [place] o Wendling, DO o [address] [place], [place] [address] o [phone] o Mon-Fri 8:00-5:00 o Babies seen by [facility] providers o Does NOT accept Medicaid o Limited availability, please call early in hospitalization to schedule follow-up . [facility] o Calderon, PA; Cummings, MD; Dillard, MD; Martin, PA; Olson, MD; VanDeven, PA o [address], [place], [place] [address] o [phone] o Mon-Fri 8:30-5:00, Sat 9:00-12:00 o Babies seen by providers at [facility] o Accepting Medicaid o Please register online then schedule online or call office o [email or web address] . [facility] o Hunter, NP; Kumar, MD; Martin Rogers, PA o [address], [place], [place] [address] o [phone] o Mon-Fri 8:00-5:00 o Babies seen by providers at [facility] o Accepting Medicaid . [facility] o Valle [place], MD; Kristi Fleenor, NP; West, MD o [address], [place], [place] [address] o [phone] o Mon-Fri 8:00-5:30, Sat&Sun by appointment (phones open at 8:30) o Babies seen by [facility] providers o Accepting Medicaid o Must be a first-time baby or sibling of current patient . [facility] [place]  o [address], [place], [place]  [address] o [phone]   Fax - [phone]  [place] ([address]) . [facility] o Brown, PA; Cowen, PA; Rice, MD; Helton, PA; Spry, MD o [address] [place], [place] [address] o [phone] o Mon-Thur 8:00-7:00, Fri 8:00-5:00, Sat 8:00-12:00, Sun 9:00-12:00 o Babies seen by [facility] providers o Accepting Medicaid . [facility] [place] o Coe-Goins, MD; Marshall, MD; Pierre-Louis, MD o [address] [place] [address], [place],  [address] o [phone] o Mon-Thur 8:00-5:00 o Babies seen by providers at [facility] o Accepting Medicaid . [facility] o Bratton, MD; Coe-Goins, MD; Hayes, MD; Lewis, MD; List, MD; Lott, MD; Marshall, MD; Moran, MD; O'Neal, MD; Pierre-Louis, MD; Pitonzo, MD; Scholer, MD; Spangle, MD o [address] [place],    [address] o [phone] o Mon-Fri 8:00-5:30, Sat (Oct.-Mar.) 9:00-1:00 o Babies seen by providers at [facility] o Accepting Medicaid o Must fill out new patient packet, available online at [email or web address] . [facility] ([facility] o Friddle, NP; Harris, NP; Kelly, NP; Logan, MD;  Melvin, PA; Poth, MD; Ramadoss, MD; Stanton, NP o [address], [place], [place] [address] o [phone] o Mon-Thur 8:00-5:30, Fri 8:00-5:00 o Babies seen by providers at [facility] o Accepting Medicaid  [place] ([address]) . [facility] o Dixon, PA; [place], MD; Pickard, MD; Tapia, PA o [address], [place], [place] [address] o [phone] o Mon-Fri 8:00-5:00 o Babies seen by providers at [facility] o Accepting Medicaid   [place] ([address]) . [facility] [place] o Masneri, DO; Meyers, MD; Nelson, PA o [address], [place], [place] [address] o [phone] o Mon-Fri 8:00-5:00 o Babies seen by providers at [facility] o Does NOT accept Medicaid o Limited appointment availability, please call early in hospitalization  . [facility] [place] o Kunedd, DO; McGowen, MD o [address], [place], [place] [address] o [phone] o Mon-Fri 8:00-5:00 o Babies seen by [facility] providers o Does NOT accept Medicaid . [facility] [place] o Cameron, MD; MacDonald, MD; Michaels, PA; Nayak, MD o [address] [place] [address], [place], [place] [address] o [phone] o Mon-Fri 8:00-5:00 o After hours clinic ([address] [place], [place] [address]) [phone] Mon-Fri 5:00-8:00, Sat 12:00-6:00, Sun 10:00-4:00 o Babies seen by [facility] providers o Accepting Medicaid . [facility] [place] o [address], [place], [place]  [address] o [phone]   Fax - [phone]  [place] ([address]) . [facility] [place] o Andy, MD o [address], [place], [place] [address] o [phone] o Mon-Fri 8:00-5:00 o Babies seen by [facility] providers o Does NOT accept Medicaid . [facility] [place] ([facility] [place]) o Eksir, MD o [address], [place], [place]  [address] o [phone] o Mon-Thur 8:00-7:00, Fri 8:00-5:00, Sat 8:00-12:00 o Babies seen by providers at [facility] o Accepting Medicaid - but does not have vaccinations in office (must be received elsewhere) o Limited availability, please call early in hospitalization  [place] ([address]) . [facility]  o Charlene [place]ing, MD o [address], [place] [address] o [phone]  Fax [phone]   

## 2019-08-26 NOTE — Progress Notes (Signed)
Patient ID: [name], female   DOB: 15-Mar-1991, 28 y.o.   MRN: [id number]   PRENATAL VISIT NOTE  Subjective:  Donnalynn Wheeless is a 28 y.o. G1P0000 at [redacted]w[redacted]d being seen today for ongoing prenatal care.  She is currently monitored for the following issues for this high-risk pregnancy and has ASTHMA; HEADACHE, CHRONIC; Major depressive disorder, recurrent episode, severe ([place]); Generalized anxiety disorder; Major depressive disorder, recurrent, severe without psychotic features ([place]); Supervision of high risk pregnancy, antepartum; Loud snoring; Family history of alpha thalassemia; Alpha thalassemia silent carrier; and Gestational diabetes on their problem list.  Patient reports nothing. She has been taking 500 mg of metformin nightly; she has not been taking her sugars regularly. Her fastings are always elevated. .  Contractions: Irregular. Vag. Bleeding: None.  Movement: Present. Denies leaking of fluid.   The following portions of the patient's history were reviewed and updated as appropriate: allergies, current medications, past family history, past medical history, past social history, past surgical history and problem list.    Objective:   Vitals:   08/26/19 0829  BP: 117/73  Pulse: 89  Weight: 193 lb 4.8 oz (87.7 kg)    Fetal Status: Fetal Heart Rate (bpm): NST   Movement: Present     General:  Alert, oriented and cooperative. Patient is in no acute distress.  Skin: Skin is warm and dry. No rash noted.   Cardiovascular: Normal heart rate noted  Respiratory: Normal respiratory effort, no problems with respiration noted  Abdomen: Soft, gravid, appropriate for gestational age.  Pain/Pressure: Present     Pelvic: Cervical exam performed Dilation: Fingertip      Extremities: Normal range of motion.  Edema: None  Mental Status: Normal mood and affect. Normal behavior. Normal judgment and thought content.   Assessment and Plan:  Pregnancy: G1P0000 at [redacted]w[redacted]d 1. Supervision of high  risk pregnancy, antepartum - GC/Chlamydia probe amp ([place])not at [facility] - Strep Gp B NAA  2. Gestational diabetes mellitus treated with oral hypoglycemic therapy -majority of fastings are elevated, as are postprandial breakfast values. Very little readings logged for lunch and dinner. Patient admits she has a hard time taking her Blood sugar but she agrees to do so now so as to avoid going on insulin.  -consult with Dr. Kennon Rounds, who had already reviewed patient's blood sugars; will increase metformin to 1000 mg nightly and 500 mg in the morning. Patient to have my chart video and she agrees to change in medication and understands the importance of checking sugars and taking her medication.  -Will schedule IOL at next visit  Preterm labor symptoms and general obstetric precautions including but not limited to vaginal bleeding, contractions, leaking of fluid and fetal movement were reviewed in detail with the patient. Please refer to After Visit Summary for other counseling recommendations.   Return in about 9 days (around 09/04/2019) for as scheduled.  Future Appointments  Date Time Provider [place]  08/28/2019 12:30 PM Sherol Dade, [place] [facility]  09/03/2019 12:30 PM Danie Binder, PT [facility]  09/04/2019  3:15 PM [facility] NST [facility] WOC  09/04/2019  4:15 PM Aletha Halim, MD [facility] WOC  09/11/2019 12:30 PM Danie Binder, PT [facility]  09/11/2019  3:15 PM [facility] NST [facility] WOC  09/11/2019  4:15 PM Anyanwu, Sallyanne Havers, MD [facility] WOC  09/18/2019 12:30 PM Takacs, Craig Guess, PT [facility]    [place], CNM

## 2019-08-26 NOTE — Progress Notes (Signed)
Pt states she smells "pee" all the time. Also she occasionally feels a vibration in her abdomen.  Pt states she checks CBG's 1-2 x/Caroline Gaines, logs results to [facility]

## 2019-08-26 NOTE — Progress Notes (Signed)

## 2019-08-26 NOTE — Telephone Encounter (Signed)
Received notification from [facility] that pt BP is 115/97 with no reported sx's.  Called pt and pt stated that she had just did her CBG and was placing the value into the app so she just went ahead and took her BP as well.  I advised the pt that she has her BP checked today in her ob visit and that she should check her BP if she is having blurred vision, headaches, or once a week.  Pt verbalized understanding.

## 2019-08-27 LAB — GC/CHLAMYDIA PROBE AMP (~~LOC~~) NOT AT ARMC
Chlamydia: NEGATIVE
Comment: NEGATIVE
Comment: NORMAL
Neisseria Gonorrhea: NEGATIVE

## 2019-08-28 ENCOUNTER — Encounter: Payer: Medicaid Other | Admitting: Physical Therapy

## 2019-08-28 LAB — STREP GP B NAA: Strep Gp B NAA: NEGATIVE

## 2019-09-04 ENCOUNTER — Other Ambulatory Visit: Payer: Self-pay

## 2019-09-04 ENCOUNTER — Ambulatory Visit (INDEPENDENT_AMBULATORY_CARE_PROVIDER_SITE_OTHER): Payer: BC Managed Care – PPO | Admitting: *Deleted

## 2019-09-04 ENCOUNTER — Ambulatory Visit (INDEPENDENT_AMBULATORY_CARE_PROVIDER_SITE_OTHER): Payer: Medicaid Other | Admitting: Obstetrics and Gynecology

## 2019-09-04 ENCOUNTER — Ambulatory Visit: Payer: Self-pay

## 2019-09-04 VITALS — BP 123/69 | HR 95 | Wt 195.5 lb

## 2019-09-04 DIAGNOSIS — O0993 Supervision of high risk pregnancy, unspecified, third trimester: Secondary | ICD-10-CM

## 2019-09-04 DIAGNOSIS — O24415 Gestational diabetes mellitus in pregnancy, controlled by oral hypoglycemic drugs: Secondary | ICD-10-CM

## 2019-09-04 DIAGNOSIS — Z3A37 37 weeks gestation of pregnancy: Secondary | ICD-10-CM

## 2019-09-04 DIAGNOSIS — A599 Trichomoniasis, unspecified: Secondary | ICD-10-CM | POA: Insufficient documentation

## 2019-09-04 DIAGNOSIS — D563 Thalassemia minor: Secondary | ICD-10-CM

## 2019-09-04 DIAGNOSIS — O0990 Supervision of high risk pregnancy, unspecified, unspecified trimester: Secondary | ICD-10-CM

## 2019-09-04 HISTORY — DX: Trichomoniasis, unspecified: A59.9

## 2019-09-04 MED ORDER — METFORMIN HCL 500 MG PO TABS: ORAL_TABLET | ORAL | 2 refills | Status: DC

## 2019-09-04 NOTE — Progress Notes (Signed)
IOL scheduled 12/15 in AM

## 2019-09-04 NOTE — Progress Notes (Signed)
Pt states she has had some elevated BP's recently. Per chart review, she was advised to go to [facility] for evaluation however did not. Pt also states she is getting frequent hot flashes.

## 2019-09-04 NOTE — Progress Notes (Signed)
Prenatal Visit Note Date: 09/04/2019 Clinic: [facility]  Subjective:  Caroline Gaines is a 28 y.o. G1P0000 at [redacted]w[redacted]d being seen today for ongoing prenatal care.  She is currently monitored for the following issues for this high-risk pregnancy and has ASTHMA; HEADACHE, CHRONIC; Major depressive disorder, recurrent episode, severe ([place]); Generalized anxiety disorder; Major depressive disorder, recurrent, severe without psychotic features ([place]); Supervision of high risk pregnancy, antepartum; Loud snoring; Family history of alpha thalassemia; Alpha thalassemia silent carrier; Gestational diabetes; and Trichomoniasis on their problem list.  Patient reports no complaints.   Contractions: Irregular. Vag. Bleeding: None.  Movement: Present. Denies leaking of fluid.   The following portions of the patient's history were reviewed and updated as appropriate: allergies, current medications, past family history, past medical history, past social history, past surgical history and problem list. Problem list updated.  Objective:   Vitals:   09/04/19 1520  BP: 123/69  Pulse: 95  Weight: 195 lb 8 oz (88.7 kg)    Fetal Status: Fetal Heart Rate (bpm): NST   Movement: Present     General:  Alert, oriented and cooperative. Patient is in no acute distress.  Skin: Skin is warm and dry. No rash noted.   Cardiovascular: Normal heart rate noted  Respiratory: Normal respiratory effort, no problems with respiration noted  Abdomen: Soft, gravid, appropriate for gestational age. Pain/Pressure: Present     Pelvic:  Cervical exam deferred        Extremities: Normal range of motion.  Edema: None  Mental Status: Normal mood and affect. Normal behavior. Normal judgment and thought content.   Urinalysis:      Assessment and Plan:  Pregnancy: G1P0000 at [redacted]w[redacted]d  1. Supervision of high risk pregnancy, antepartum Routine care - [place] MFM OB FOLLOW UP; Future  2. Gestational diabetes mellitus  (GDM) in third trimester controlled on oral hypoglycemic drug On metformin 500/1000 qhs but sometimes mixed up when to take them. AM fastings are slightly high in the 100s and the 2 hour pp are inconsistent in taking but normal to slightly high. Will increase 1000/1000 and told to make sure pm dose is qhs Patient needs rpt growth (ordered) bpp 10/10 Patient set up for IOL at 39wks - [place] MFM OB FOLLOW UP; Future  3. Alpha thalassemia silent carrier No issues  Preterm labor symptoms and general obstetric precautions including but not limited to vaginal bleeding, contractions, leaking of fluid and fetal movement were reviewed in detail with the patient. Please refer to After Visit Summary for other counseling recommendations.  Return in about 1 week (around 09/11/2019) for as scheduled.   Aletha Halim, MD

## 2019-09-06 ENCOUNTER — Encounter: Payer: Medicaid Other | Admitting: Obstetrics and Gynecology

## 2019-09-09 ENCOUNTER — Ambulatory Visit (HOSPITAL_COMMUNITY)
Admission: RE | Admit: 2019-09-09 | Discharge: 2019-09-09 | Disposition: A | Discharge status: Home / Self Care | Payer: BC Managed Care – PPO | Source: Ambulatory Visit | Attending: Obstetrics and Gynecology | Admitting: Obstetrics and Gynecology

## 2019-09-09 ENCOUNTER — Other Ambulatory Visit: Payer: Self-pay | Admitting: Family Medicine

## 2019-09-09 ENCOUNTER — Other Ambulatory Visit: Payer: Self-pay

## 2019-09-09 DIAGNOSIS — Z362 Encounter for other antenatal screening follow-up: Secondary | ICD-10-CM | POA: Diagnosis not present

## 2019-09-09 DIAGNOSIS — O219 Vomiting of pregnancy, unspecified: Secondary | ICD-10-CM

## 2019-09-09 DIAGNOSIS — O24415 Gestational diabetes mellitus in pregnancy, controlled by oral hypoglycemic drugs: Secondary | ICD-10-CM | POA: Diagnosis not present

## 2019-09-09 DIAGNOSIS — O0990 Supervision of high risk pregnancy, unspecified, unspecified trimester: Secondary | ICD-10-CM | POA: Insufficient documentation

## 2019-09-09 DIAGNOSIS — Z3A37 37 weeks gestation of pregnancy: Secondary | ICD-10-CM | POA: Diagnosis not present

## 2019-09-10 ENCOUNTER — Encounter: Payer: Self-pay | Admitting: *Deleted

## 2019-09-10 ENCOUNTER — Telehealth (HOSPITAL_COMMUNITY): Payer: Self-pay | Admitting: *Deleted

## 2019-09-10 NOTE — Telephone Encounter (Signed)
Preadmission screen  

## 2019-09-11 ENCOUNTER — Other Ambulatory Visit: Payer: Self-pay | Admitting: Advanced Practice Midwife

## 2019-09-11 ENCOUNTER — Encounter: Payer: Self-pay | Admitting: Obstetrics & Gynecology

## 2019-09-11 ENCOUNTER — Ambulatory Visit (INDEPENDENT_AMBULATORY_CARE_PROVIDER_SITE_OTHER): Payer: BC Managed Care – PPO | Admitting: *Deleted

## 2019-09-11 ENCOUNTER — Ambulatory Visit (INDEPENDENT_AMBULATORY_CARE_PROVIDER_SITE_OTHER): Payer: Medicaid Other | Admitting: Obstetrics & Gynecology

## 2019-09-11 ENCOUNTER — Telehealth (HOSPITAL_COMMUNITY): Payer: Self-pay | Admitting: *Deleted

## 2019-09-11 ENCOUNTER — Encounter (HOSPITAL_COMMUNITY): Payer: Self-pay | Admitting: *Deleted

## 2019-09-11 ENCOUNTER — Other Ambulatory Visit: Payer: Self-pay

## 2019-09-11 VITALS — BP 116/65 | HR 88 | Wt 192.8 lb

## 2019-09-11 DIAGNOSIS — O99891 Other specified diseases and conditions complicating pregnancy: Secondary | ICD-10-CM

## 2019-09-11 DIAGNOSIS — Z3A38 38 weeks gestation of pregnancy: Secondary | ICD-10-CM

## 2019-09-11 DIAGNOSIS — O24415 Gestational diabetes mellitus in pregnancy, controlled by oral hypoglycemic drugs: Secondary | ICD-10-CM | POA: Diagnosis not present

## 2019-09-11 DIAGNOSIS — Z283 Underimmunization status: Secondary | ICD-10-CM | POA: Insufficient documentation

## 2019-09-11 DIAGNOSIS — M549 Dorsalgia, unspecified: Secondary | ICD-10-CM

## 2019-09-11 DIAGNOSIS — O09899 Supervision of other high risk pregnancies, unspecified trimester: Secondary | ICD-10-CM | POA: Insufficient documentation

## 2019-09-11 DIAGNOSIS — O0993 Supervision of high risk pregnancy, unspecified, third trimester: Secondary | ICD-10-CM

## 2019-09-11 DIAGNOSIS — O0990 Supervision of high risk pregnancy, unspecified, unspecified trimester: Secondary | ICD-10-CM

## 2019-09-11 DIAGNOSIS — O219 Vomiting of pregnancy, unspecified: Secondary | ICD-10-CM

## 2019-09-11 MED ORDER — ONDANSETRON 4 MG PO TBDP: 4.0000 mg | ORAL_TABLET | Freq: Four times a day (QID) | ORAL | 2 refills | Status: DC | PRN

## 2019-09-11 MED ORDER — CYCLOBENZAPRINE HCL 10 MG PO TABS: 5.0000 mg | ORAL_TABLET | Freq: Three times a day (TID) | ORAL | 1 refills | Status: DC | PRN

## 2019-09-11 NOTE — Progress Notes (Signed)
PRENATAL VISIT NOTE  Subjective:  Caroline Gaines is a 28 y.o. G1P0000 at [redacted]w[redacted]d being seen today for ongoing prenatal care.  She is currently monitored for the following issues for this high-risk pregnancy and has ASTHMA; HEADACHE, CHRONIC; Major depressive disorder, recurrent episode, severe (HCC); Generalized anxiety disorder; Major depressive disorder, recurrent, severe without psychotic features (HCC); Supervision of high risk pregnancy, antepartum; Loud snoring; Family history of alpha thalassemia; Alpha thalassemia silent carrier; Gestational diabetes; Trichomoniasis; and Rubella non-immune status, antepartum on their problem list.  Patient reports nausea.  Contractions: Irregular. Vag. Bleeding: None.  Movement: Present. Denies leaking of fluid.   The following portions of the patient's history were reviewed and updated as appropriate: allergies, current medications, past family history, past medical history, past social history, past surgical history and problem list.   Objective:   Vitals:   09/11/19 1532  BP: 116/65  Pulse: 88  Weight: 192 lb 12.8 oz (87.5 kg)    Fetal Status: Fetal Heart Rate (bpm): NST   Movement: Present     General:  Alert, oriented and cooperative. Patient is in no acute distress.  Skin: Skin is warm and dry. No rash noted.   Cardiovascular: Normal heart rate noted  Respiratory: Normal respiratory effort, no problems with respiration noted  Abdomen: Soft, gravid, appropriate for gestational age.  Pain/Pressure: Present     Pelvic: Cervical exam deferred        Extremities: Normal range of motion.     Mental Status: Normal mood and affect. Normal behavior. Normal judgment and thought content.   Imaging: [place] Mfm Ob Follow Up  Result Date: 09/09/2019 ----------------------------------------------------------------------  OBSTETRICS REPORT                       (Signed Final 09/09/2019 05:08 pm)  ---------------------------------------------------------------------- Patient Info  ID #:       [id number]                          D.O.B.:  08-09-1991 (28 yrs)  Name:       Caroline Gaines                Visit Date: 09/09/2019 03:54 pm ---------------------------------------------------------------------- Performed By  Performed By:     Tommie Raymond BS,       Ref. Address:     [address]                    [place]                                                             [address]  Attending:        Noralee Space MD        [facility]  Referred By:      [facility] ----------------------------------------------------------------------  Orders   #  Description                          Code         Ordered By   1  [place] MFM OB FOLLOW UP                  [id number]     CHARLIE PICKENS  ----------------------------------------------------------------------   #  Order #                    Accession #                 Episode #   1  [id number]                  [id number]                  [id number]  ---------------------------------------------------------------------- Indications   [redacted] weeks gestation of pregnancy                Z3A.37   Gestational diabetes in pregnancy,             O24.415   controlled by oral hypoglycemic drugs   Genetic carrier (silent alpha thal)            Z14.8   Encounter for other antenatal screening        Z36.2   follow-up  ---------------------------------------------------------------------- Vital Signs                                                 Height:        5'5" ---------------------------------------------------------------------- Fetal Evaluation  Num Of Fetuses:         1  Fetal Heart Rate(bpm):  162  Cardiac Activity:       Observed  Presentation:           Cephalic  Placenta:               Anterior  P. Cord Insertion:      Previously Visualized  Amniotic Fluid  AFI FV:      Within normal  limits  AFI Sum(cm)     %Tile       Largest Pocket(cm)  18.83           73          5.38  RUQ(cm)       RLQ(cm)       LUQ(cm)        LLQ(cm)  5.38          4.49          4.31           4.65 ---------------------------------------------------------------------- Biophysical Evaluation  Amniotic F.V:   Within normal limits       F. Tone:        Observed  F. Movement:    Observed                   Score:          8/8  F. Breathing:   Observed ---------------------------------------------------------------------- Biometry  BPD:      91.9  mm     G. Age:  37w 2d         58  %  CI:        78.84   %    70 - 86                                                          FL/HC:      20.7   %    20.9 - 22.7  HC:      327.3  mm     G. Age:  37w 1d         15  %    HC/AC:      0.98        0.92 - 1.05  AC:      332.3  mm     G. Age:  37w 1d         47  %    FL/BPD:     73.8   %    71 - 87  FL:       67.8  mm     G. Age:  34w 6d          2  %    FL/AC:      20.4   %    20 - 24  HUM:      56.7  mm     G. Age:  33w 0d        < 5  %  LV:        4.1  mm  Est. FW:    2981  gm      6 lb 9 oz     29  % ---------------------------------------------------------------------- OB History  Gravidity:    1         Term:   0        Prem:   0        SAB:   0  TOP:          0       Ectopic:  0        Living: 0 ---------------------------------------------------------------------- Gestational Age  LMP:           37w 6d        Date:  12/18/18                 EDD:   09/24/19  U/S Today:     36w 4d                                        EDD:   10/03/19  Best:          37w 6d     Det. By:  LMP  (12/18/18)          EDD:   09/24/19 ---------------------------------------------------------------------- Anatomy  Cranium:               Previously seen        LVOT:                   Previously seen  Cavum:                 Previously seen        Aortic Arch:  Previously seen  Ventricles:            Appears normal         Ductal Arch:             Previously seen  Choroid Plexus:        Previously seen        Diaphragm:              Appears normal  Cerebellum:            Previously seen        Stomach:                Appears normal, left                                                                        sided  Posterior Fossa:       Previously seen        Abdomen:                Previously seen  Nuchal Fold:           Previously seen        Abdominal Wall:         Appears nml (cord                                                                        insert, abd wall)  Face:                  Orbits and profile     Cord Vessels:           Previously seen                         previously seen  Lips:                  Previously seen        Kidneys:                Appear normal  Palate:                Previously seen        Bladder:                Appears normal  Thoracic:              Previously seen        Spine:                  Previously seen  Heart:                 Previously seen        Upper Extremities:      Previously seen  RVOT:                  Previously seen        Lower Extremities:  Previously seen  Other:  Heels and 5th digit previously visualized. Nasal bone previously seen.          Technically difficult due to advanced gestational age. ---------------------------------------------------------------------- Doppler - Fetal Vessels  Umbilical Artery   S/D     %tile     RI                                     ADFV    RDFV  2.85       81   0.65                                         No      No ---------------------------------------------------------------------- Cervix Uterus Adnexa  Cervix  Not visualized (advanced GA >24wks)  Uterus  No abnormality visualized.  Left Ovary  Within normal limits. No adnexal mass visualized.  Right Ovary  Within normal limits. No adnexal mass visualized.  Cul De Sac  No free fluid seen.  Adnexa  No abnormality visualized. ---------------------------------------------------------------------- Impression   Gestational diabetes. Patient takes metformin for control.  Amniotic fluid is normal and good fetal activity is seen. Fetal  growth is appropriate for gestational age. Antenatal testing is  reassuring. BPP 8/8. ---------------------------------------------------------------------- Recommendations  -Patient has an appointment for BPP at your office on  09/11/19 (not charged for BPP today). ----------------------------------------------------------------------                  Tama High, MD Electronically Signed Final Report   09/09/2019 05:08 pm ----------------------------------------------------------------------  US Fetal Bpp W/nonstress  Result Date: 09/09/2019 ----------------------------------------------------------------------  OBSTETRICS REPORT                       (Signed Final 09/09/2019 05:21 pm) ---------------------------------------------------------------------- Patient Info  ID #:       [id number]                          D.O.B.:  [birth year] (28 yrs)  Name:       Caroline Gaines                Visit Date: 09/04/2019 03:44 pm ---------------------------------------------------------------------- Performed By  Performed By:     Shauna Hugh Day RNC          Ref. Address:     [address]                                                             [address]  Attending:        Aletha Halim MD     [facility]  Referred By:      [facility] ---------------------------------------------------------------------- Orders   #  Description                          Code         Ordered By   1  US FETAL BPP W/NONSTRESS             81191.4      [place] Bing  ----------------------------------------------------------------------   #  Order #                    Accession #                 Episode #   1  [id number]                  [id number]                   [id number]  ---------------------------------------------------------------------- Service(s) Provided   US Fetal BPP W NST                                   [id number]  ---------------------------------------------------------------------- Indications   [redacted] weeks gestation of pregnancy                Z3A.37   Gestational diabetes in pregnancy,             O24.415   controlled by oral hypoglycemic drugs  ---------------------------------------------------------------------- Vital Signs                                                 Height:        5'5" ---------------------------------------------------------------------- Fetal Evaluation  Num Of Fetuses:         1  Preg. Location:         Intrauterine  Cardiac Activity:       Observed  Presentation:           Cephalic  Amniotic Fluid  AFI FV:      Within normal limits  AFI Sum(cm)     %Tile       Largest Pocket(cm)  18.72           72          7.45  RUQ(cm)       RLQ(cm)       LUQ(cm)        LLQ(cm)  7.45          3.82          3.95           3.5 ---------------------------------------------------------------------- Biophysical Evaluation  Amniotic F.V:   Pocket => 2 cm             F. Tone:        Observed  F. Movement:    Observed                   N.S.T:          Reactive  F. Breathing:   Observed                   Score:          10/10 ---------------------------------------------------------------------- OB History  Gravidity:    1         Term:   0        Prem:   0        SAB:   0  TOP:          0       Ectopic:  0        Living: 0 ---------------------------------------------------------------------- Gestational Age  LMP:           37w 1d        Date:  12/18/18                 EDD:   09/24/19  Best:          37w 1d     Det. By:  LMP  (12/18/18)          EDD:   09/24/19 ---------------------------------------------------------------------- Impression  Reassuring antenatal testing ----------------------------------------------------------------------  Recommendations  Continue weekly testing ----------------------------------------------------------------------                 Sparkill Bing, MD Electronically Signed Final Report   09/09/2019 05:21 pm ----------------------------------------------------------------------  US Fetal Bpp W/nonstress  Result Date: 08/28/2019 ----------------------------------------------------------------------  OBSTETRICS REPORT                       (Signed Final 08/28/2019 07:25 am) ---------------------------------------------------------------------- Patient Info  ID #:       [id number]                          D.O.B.:  1991/05/17 (28 yrs)  Name:       Caroline Gaines                Visit Date: 08/26/2019 08:57 am ---------------------------------------------------------------------- Performed By  Performed By:     Sedalia Muta Day RNC          Ref. Address:     [address]                                                             [address]  Attending:        Tinnie Gens MD         Location[facility]         [facility]                                                             [facility]  Referred By:      [facility] ---------------------------------------------------------------------- Orders   #  Description                          Code  Ordered By   1  US FETAL BPP W/NONSTRESS             16109.6      Tinnie Gens  ----------------------------------------------------------------------   #  Order #                    Accession #                 Episode #   1  [id number]                  [id number]                  [id number]  ---------------------------------------------------------------------- Service(s) Provided   US Fetal BPP W NST                                   [phone]  ---------------------------------------------------------------------- Indications   [redacted] weeks gestation of pregnancy                Z3A.35   Gestational diabetes in pregnancy,              O24.415   controlled by oral hypoglycemic drugs  ---------------------------------------------------------------------- Vital Signs                                                 Height:        5'5" ---------------------------------------------------------------------- Fetal Evaluation  Num Of Fetuses:         1  Preg. Location:         Intrauterine  Cardiac Activity:       Observed  Presentation:           Cephalic  Amniotic Fluid  AFI FV:      Within normal limits  AFI Sum(cm)     %Tile       Largest Pocket(cm)  16.29           60          4.84  RUQ(cm)       RLQ(cm)       LUQ(cm)        LLQ(cm)  3.64          4.84          4.13           3.68 ---------------------------------------------------------------------- Biophysical Evaluation  Amniotic F.V:   Pocket => 2 cm             F. Tone:        Observed  F. Movement:    Observed                   N.S.T:          Reactive  F. Breathing:   Observed                   Score:          10/10 ---------------------------------------------------------------------- OB History  Gravidity:    1         Term:   0        Prem:   0        SAB:   0  TOP:          0  Ectopic:  0        Living: 0 ---------------------------------------------------------------------- Gestational Age  LMP:           35w 6d        Date:  12/18/18                 EDD:   09/24/19  Best:          Consuello Closs 6d     Det. By:  LMP  (12/18/18)          EDD:   09/24/19 ---------------------------------------------------------------------- Impression  BPP 8/8 ---------------------------------------------------------------------- Recommendations  -Continue weekly BPP till delivery. ----------------------------------------------------------------------                   Tinnie Gens, MD Electronically Signed Final Report   08/28/2019 07:25 am ----------------------------------------------------------------------  US Fetal Bpp W/nonstress  Result Date:  08/26/2019 ----------------------------------------------------------------------  OBSTETRICS REPORT                        (Signed Final 08/26/2019 08:32 am) ---------------------------------------------------------------------- Patient Info  ID #:       [id number]                          D.O.B.:  25-Jan-1991 (28 yrs)  Name:       Caroline Gaines                Visit Date: 08/21/2019 03:06 pm ---------------------------------------------------------------------- Performed By  Performed By:     Sedalia Muta Day RNC          Ref. Address:      [address]                                                              [address]  Attending:        Tinnie Gens MD         Location[facility]          [facility]  Referred By:      [facility] ---------------------------------------------------------------------- Orders   #  Description                           Code        Ordered By   1  US FETAL BPP W/NONSTRESS              99833.8     MYRA DOVE  ----------------------------------------------------------------------   #  Order #  Accession #                Episode #   1  [id number]                   [id number]                 [id number]  ---------------------------------------------------------------------- Service(s) Provided   US Fetal BPP W NST                                    [phone]  ---------------------------------------------------------------------- Indications   [redacted] weeks gestation of pregnancy                Z3A.35   Diabetes - Gestational                         O24.419  ---------------------------------------------------------------------- Vital Signs                                                 Height:        5'5" ---------------------------------------------------------------------- Fetal Evaluation  Num Of Fetuses:          1  Preg. Location:           Intrauterine  Cardiac Activity:        Observed  Presentation:            Cephalic  Amniotic Fluid  AFI FV:      Within normal limits  AFI Sum(cm)     %Tile       Largest Pocket(cm)  17.42           64          5.62  RUQ(cm)       RLQ(cm)       LUQ(cm)        LLQ(cm)  5.62          3.91          3.09           4.8 ---------------------------------------------------------------------- Biophysical Evaluation  Amniotic F.V:   Pocket => 2 cm             F. Tone:         Observed  F. Movement:    Observed                   N.S.T:           Reactive  F. Breathing:   Observed                   Score:           10/10 ---------------------------------------------------------------------- OB History  Gravidity:    1         Term:   0        Prem:   0        SAB:   0  TOP:          0       Ectopic:  0        Living: 0 ---------------------------------------------------------------------- Gestational Age  LMP:           35w 1d        Date:  12/18/18  EDD:   09/24/19  Best:          35w 1d     Det. By:  LMP  (12/18/18)          EDD:   09/24/19 ---------------------------------------------------------------------- Impression  BPP 8/8 ---------------------------------------------------------------------- Recommendations  -Continue weekly BPP till delivery. ----------------------------------------------------------------------                   Tinnie Gens, MD Electronically Signed Final Report   08/26/2019 08:32 am ----------------------------------------------------------------------   Assessment and Plan:  Pregnancy: G1P0000 at [redacted]w[redacted]d 1. Gestational diabetes mellitus (GDM) in third trimester controlled on oral hypoglycemic drug A few elevated fasting 98, 102.  Has more nausea due to increased Metformin dosage. Continue dosage for now. Recent BPP two days ago, NST performed today was reviewed and was found to be reactive.  IOL scheduled at 39 weeks.  2. Nausea and vomiting during pregnancy Zofran  refilled. - ondansetron (ZOFRAN-ODT) 4 MG disintegrating tablet; Take 1 tablet (4 mg total) by mouth every 6 (six) hours as needed for nausea or vomiting.  Dispense: 30 tablet; Refill: 2  3. Back pain affecting pregnancy in third trimester Flexeril refilled - cyclobenzaprine (FLEXERIL) 10 MG tablet; Take 0.5-1 tablets (5-10 mg total) by mouth 3 (three) times daily as needed for muscle spasms.  Dispense: 30 tablet; Refill: 1  4. Supervision of high risk pregnancy, antepartum Term labor symptoms and general obstetric precautions including but not limited to vaginal bleeding, contractions, leaking of fluid and fetal movement were reviewed in detail with the patient. Please refer to After Visit Summary for other counseling recommendations.   Return for Postpartum visit.  Future Appointments  Date Time Provider Department Center  09/11/2019  4:15 PM Saramarie Stinger, Jethro Bastos, MD [facility] WOC  09/14/2019  9:35 AM [facility]-SCREENING [facility] None  09/17/2019  7:30 AM [facility]-LD SCHED ROOM [facility]IN[facility] None    Jaynie Collins, MD

## 2019-09-11 NOTE — Progress Notes (Signed)
Pt had [place] for growth/BPP on 12/7. She states she has been having a lot of vomiting and back pain.  She requests refill of Zofran.  IOL scheduled 12/15.

## 2019-09-11 NOTE — Telephone Encounter (Signed)
Preadmission screen  

## 2019-09-11 NOTE — Patient Instructions (Signed)

## 2019-09-12 ENCOUNTER — Other Ambulatory Visit (HOSPITAL_COMMUNITY): Payer: Self-pay | Admitting: Student

## 2019-09-13 ENCOUNTER — Encounter: Payer: Medicaid Other | Admitting: Obstetrics and Gynecology

## 2019-09-13 ENCOUNTER — Other Ambulatory Visit: Payer: Self-pay | Admitting: Obstetrics and Gynecology

## 2019-09-14 ENCOUNTER — Other Ambulatory Visit (HOSPITAL_COMMUNITY)
Admission: RE | Admit: 2019-09-14 | Discharge: 2019-09-14 | Disposition: A | Discharge status: Home / Self Care | Payer: BC Managed Care – PPO | Source: Ambulatory Visit | Attending: Family Medicine | Admitting: Family Medicine

## 2019-09-14 DIAGNOSIS — Z01812 Encounter for preprocedural laboratory examination: Secondary | ICD-10-CM | POA: Insufficient documentation

## 2019-09-14 DIAGNOSIS — Z20828 Contact with and (suspected) exposure to other viral communicable diseases: Secondary | ICD-10-CM | POA: Diagnosis not present

## 2019-09-14 LAB — SARS CORONAVIRUS 2 (TAT 6-24 HRS): SARS Coronavirus 2: NEGATIVE

## 2019-09-17 ENCOUNTER — Inpatient Hospital Stay (HOSPITAL_COMMUNITY): Payer: BC Managed Care – PPO | Admitting: Anesthesiology

## 2019-09-17 ENCOUNTER — Encounter (HOSPITAL_COMMUNITY): Payer: Self-pay | Admitting: Student

## 2019-09-17 ENCOUNTER — Other Ambulatory Visit: Payer: Self-pay

## 2019-09-17 ENCOUNTER — Inpatient Hospital Stay (HOSPITAL_COMMUNITY)
Admission: AD | Admit: 2019-09-17 | Discharge: 2019-09-19 | DRG: 806 | Disposition: A | Discharge status: Home / Self Care | Payer: BC Managed Care – PPO | Attending: Obstetrics & Gynecology | Admitting: Obstetrics & Gynecology

## 2019-09-17 ENCOUNTER — Inpatient Hospital Stay (HOSPITAL_COMMUNITY): Payer: BC Managed Care – PPO

## 2019-09-17 ENCOUNTER — Encounter: Payer: Self-pay | Admitting: General Practice

## 2019-09-17 DIAGNOSIS — O0990 Supervision of high risk pregnancy, unspecified, unspecified trimester: Secondary | ICD-10-CM

## 2019-09-17 DIAGNOSIS — O99344 Other mental disorders complicating childbirth: Secondary | ICD-10-CM | POA: Diagnosis present

## 2019-09-17 DIAGNOSIS — Z3A39 39 weeks gestation of pregnancy: Secondary | ICD-10-CM | POA: Diagnosis not present

## 2019-09-17 DIAGNOSIS — O24425 Gestational diabetes mellitus in childbirth, controlled by oral hypoglycemic drugs: Secondary | ICD-10-CM | POA: Diagnosis present

## 2019-09-17 DIAGNOSIS — F332 Major depressive disorder, recurrent severe without psychotic features: Secondary | ICD-10-CM | POA: Diagnosis present

## 2019-09-17 DIAGNOSIS — D563 Thalassemia minor: Secondary | ICD-10-CM

## 2019-09-17 DIAGNOSIS — O24424 Gestational diabetes mellitus in childbirth, insulin controlled: Secondary | ICD-10-CM | POA: Diagnosis not present

## 2019-09-17 DIAGNOSIS — Z87891 Personal history of nicotine dependence: Secondary | ICD-10-CM | POA: Diagnosis not present

## 2019-09-17 DIAGNOSIS — Z20828 Contact with and (suspected) exposure to other viral communicable diseases: Secondary | ICD-10-CM | POA: Diagnosis present

## 2019-09-17 DIAGNOSIS — O24419 Gestational diabetes mellitus in pregnancy, unspecified control: Secondary | ICD-10-CM | POA: Diagnosis present

## 2019-09-17 DIAGNOSIS — O09899 Supervision of other high risk pregnancies, unspecified trimester: Secondary | ICD-10-CM

## 2019-09-17 DIAGNOSIS — O99214 Obesity complicating childbirth: Secondary | ICD-10-CM | POA: Diagnosis present

## 2019-09-17 DIAGNOSIS — Z283 Underimmunization status: Secondary | ICD-10-CM

## 2019-09-17 DIAGNOSIS — E669 Obesity, unspecified: Secondary | ICD-10-CM | POA: Diagnosis present

## 2019-09-17 DIAGNOSIS — F411 Generalized anxiety disorder: Secondary | ICD-10-CM | POA: Diagnosis present

## 2019-09-17 DIAGNOSIS — Z832 Family history of diseases of the blood and blood-forming organs and certain disorders involving the immune mechanism: Secondary | ICD-10-CM

## 2019-09-17 DIAGNOSIS — O24415 Gestational diabetes mellitus in pregnancy, controlled by oral hypoglycemic drugs: Secondary | ICD-10-CM

## 2019-09-17 LAB — CBC
HCT: 38.3 % (ref 36.0–46.0)
HCT: 36 % (ref 36.0–46.0)
Hemoglobin: 12.3 g/dL (ref 12.0–15.0)
Hemoglobin: 11.4 g/dL — ABNORMAL LOW (ref 12.0–15.0)
MCH: 26.3 pg (ref 26.0–34.0)
MCH: 26 pg (ref 26.0–34.0)
MCHC: 32.1 g/dL (ref 30.0–36.0)
MCHC: 31.7 g/dL (ref 30.0–36.0)
MCV: 82 fL (ref 80.0–100.0)
MCV: 82 fL (ref 80.0–100.0)
Platelets: 364 10*3/uL (ref 150–400)
Platelets: 372 10*3/uL (ref 150–400)
RBC: 4.67 MIL/uL (ref 3.87–5.11)
RBC: 4.39 MIL/uL (ref 3.87–5.11)
RDW: 14.3 % (ref 11.5–15.5)
RDW: 14.2 % (ref 11.5–15.5)
WBC: 9.2 10*3/uL (ref 4.0–10.5)
WBC: 11.7 10*3/uL — ABNORMAL HIGH (ref 4.0–10.5)
nRBC: 0 % (ref 0.0–0.2)
nRBC: 0 % (ref 0.0–0.2)

## 2019-09-17 LAB — GLUCOSE, CAPILLARY
Glucose-Capillary: 86 mg/dL (ref 70–99)
Glucose-Capillary: 88 mg/dL (ref 70–99)
Glucose-Capillary: 79 mg/dL (ref 70–99)
Glucose-Capillary: 76 mg/dL (ref 70–99)

## 2019-09-17 LAB — TYPE AND SCREEN
ABO/RH(D): O POS
Antibody Screen: NEGATIVE

## 2019-09-17 LAB — ABO/RH: ABO/RH(D): O POS

## 2019-09-17 LAB — RPR: RPR Ser Ql: NONREACTIVE

## 2019-09-17 MED ORDER — TERBUTALINE SULFATE 1 MG/ML IJ SOLN: 0.2500 mg | Freq: Once | INTRAMUSCULAR | Status: DC | PRN

## 2019-09-17 MED ORDER — LACTATED RINGERS IV SOLN: INTRAVENOUS | Status: DC

## 2019-09-17 MED ORDER — OXYTOCIN 40 UNITS IN NORMAL SALINE INFUSION - SIMPLE MED
2.5000 [IU]/h | INTRAVENOUS | Status: DC
  Filled 2019-09-17: qty 1000

## 2019-09-17 MED ORDER — SODIUM CHLORIDE (PF) 0.9 % IJ SOLN
INTRAMUSCULAR | Status: DC | PRN
  Administered 2019-09-17: 12 mL/h via EPIDURAL

## 2019-09-17 MED ORDER — PHENYLEPHRINE 40 MCG/ML (10ML) SYRINGE FOR IV PUSH (FOR BLOOD PRESSURE SUPPORT)
PREFILLED_SYRINGE | INTRAVENOUS | Status: AC
  Administered 2019-09-18: 40 ug
  Filled 2019-09-17: qty 10

## 2019-09-17 MED ORDER — FENTANYL CITRATE (PF) 100 MCG/2ML IJ SOLN
100.0000 ug | INTRAMUSCULAR | Status: DC | PRN
  Administered 2019-09-17 (×3): 100 ug via INTRAVENOUS
  Filled 2019-09-17 (×2): qty 2

## 2019-09-17 MED ORDER — BUTORPHANOL TARTRATE 1 MG/ML IJ SOLN
1.0000 mg | INTRAMUSCULAR | Status: DC | PRN
  Administered 2019-09-17: 1 mg via INTRAVENOUS

## 2019-09-17 MED ORDER — LACTATED RINGERS IV SOLN
500.0000 mL | INTRAVENOUS | Status: DC | PRN
  Administered 2019-09-17 – 2019-09-18 (×2): 500 mL via INTRAVENOUS

## 2019-09-17 MED ORDER — OXYTOCIN BOLUS FROM INFUSION
500.0000 mL | Freq: Once | INTRAVENOUS | Status: AC
  Administered 2019-09-18: 500 mL via INTRAVENOUS

## 2019-09-17 MED ORDER — MISOPROSTOL 25 MCG QUARTER TABLET: 25.0000 ug | ORAL_TABLET | ORAL | Status: DC | PRN

## 2019-09-17 MED ORDER — FENTANYL CITRATE (PF) 100 MCG/2ML IJ SOLN
INTRAMUSCULAR | Status: AC
  Filled 2019-09-17: qty 2

## 2019-09-17 MED ORDER — TERBUTALINE SULFATE 1 MG/ML IJ SOLN
0.2500 mg | Freq: Once | INTRAMUSCULAR | Status: AC | PRN
  Administered 2019-09-18: 0.25 mg via SUBCUTANEOUS

## 2019-09-17 MED ORDER — LIDOCAINE HCL (PF) 1 % IJ SOLN
30.0000 mL | INTRAMUSCULAR | Status: AC | PRN
  Administered 2019-09-17 (×2): 4 mL via SUBCUTANEOUS

## 2019-09-17 MED ORDER — MISOPROSTOL 50MCG HALF TABLET
ORAL_TABLET | ORAL | Status: AC
  Filled 2019-09-17: qty 1

## 2019-09-17 MED ORDER — BUTORPHANOL TARTRATE 1 MG/ML IJ SOLN
0.5000 mg | INTRAMUSCULAR | Status: DC | PRN
  Filled 2019-09-17: qty 1

## 2019-09-17 MED ORDER — SOD CITRATE-CITRIC ACID 500-334 MG/5ML PO SOLN
30.0000 mL | ORAL | Status: DC | PRN
  Filled 2019-09-17: qty 30

## 2019-09-17 MED ORDER — ACETAMINOPHEN 325 MG PO TABS: 650.0000 mg | ORAL_TABLET | ORAL | Status: DC | PRN

## 2019-09-17 MED ORDER — OXYCODONE-ACETAMINOPHEN 5-325 MG PO TABS: 2.0000 | ORAL_TABLET | ORAL | Status: DC | PRN

## 2019-09-17 MED ORDER — MISOPROSTOL 25 MCG QUARTER TABLET
25.0000 ug | ORAL_TABLET | ORAL | Status: DC | PRN
  Administered 2019-09-17: 25 ug via VAGINAL
  Filled 2019-09-17: qty 1

## 2019-09-17 MED ORDER — FENTANYL-BUPIVACAINE-NACL 0.5-0.125-0.9 MG/250ML-% EP SOLN
EPIDURAL | Status: AC
  Filled 2019-09-17: qty 250

## 2019-09-17 MED ORDER — ONDANSETRON HCL 4 MG/2ML IJ SOLN
4.0000 mg | Freq: Four times a day (QID) | INTRAMUSCULAR | Status: DC | PRN
  Administered 2019-09-17: 4 mg via INTRAVENOUS
  Filled 2019-09-17: qty 2

## 2019-09-17 MED ORDER — OXYCODONE-ACETAMINOPHEN 5-325 MG PO TABS: 1.0000 | ORAL_TABLET | ORAL | Status: DC | PRN

## 2019-09-17 MED ORDER — MISOPROSTOL 50MCG HALF TABLET
50.0000 ug | ORAL_TABLET | ORAL | Status: DC | PRN
  Administered 2019-09-17: 50 ug via BUCCAL

## 2019-09-17 MED ORDER — OXYTOCIN 40 UNITS IN NORMAL SALINE INFUSION - SIMPLE MED
1.0000 m[IU]/min | INTRAVENOUS | Status: DC
  Administered 2019-09-17: 2 m[IU]/min via INTRAVENOUS

## 2019-09-17 MED ORDER — TERBUTALINE SULFATE 1 MG/ML IJ SOLN
0.2500 mg | Freq: Once | INTRAMUSCULAR | Status: DC | PRN
  Filled 2019-09-17: qty 1

## 2019-09-17 NOTE — Anesthesia Procedure Notes (Signed)
Epidural Patient location during procedure: OB Start time: 09/17/2019 7:24 PM End time: 09/17/2019 7:28 PM  Staffing Anesthesiologist: Audry Pili, MD Performed: anesthesiologist   Preanesthetic Checklist Completed: patient identified, IV checked, risks and benefits discussed, monitors and equipment checked, pre-op evaluation and timeout performed  Epidural Patient position: sitting Prep: DuraPrep Patient monitoring: continuous pulse ox and blood pressure Approach: midline Location: L2-L3 Injection technique: LOR saline  Needle:  Needle type: Tuohy  Needle gauge: 17 G Needle length: 9 cm Needle insertion depth: 6 cm Catheter size: 19 Gauge Catheter at skin depth: 11 cm Test dose: negative and Other (1% lidocaine)  Assessment Events: blood not aspirated  Additional Notes Patient identified. Risks including, but not limited to, bleeding, infection, nerve damage, paralysis, inadequate analgesia, blood pressure changes, nausea, vomiting, allergic reaction, postpartum back pain, itching, and headache were discussed. Patient expressed understanding and wished to proceed. Sterile prep and drape, including hand hygiene, mask, and sterile gloves were used. The patient was positioned and the spine was prepped. The skin was anesthetized with lidocaine. No paraesthesia or other complication noted. The patient did not experience any signs of intravascular injection such as tinnitus or metallic taste in mouth, nor signs of intrathecal spread such as rapid motor block. Please see nursing notes for vital signs. The patient tolerated the procedure well.   Renold Don, MDReason for block:procedure for pain

## 2019-09-17 NOTE — H&P (Signed)
OBSTETRIC ADMISSION HISTORY AND PHYSICAL  Caroline Gaines is a 28 y.o. female G1P0000 with IUP at 4w0dpresenting for IOL 2/2 A2GDM (on metformin). She reports +FMs. No LOF, VB, blurry vision, headaches, peripheral edema, or RUQ pain. She plans on bottlefeeding. She requests combined OCPs for birth control.  Dating: By LMP --->  Estimated Date of Delivery: 09/24/19  Sono:   @[redacted]w[redacted]d , normal anatomy, cephalic presentation, 29357S 29%ile, EFW 6#9  Prenatal History/Complications: AV7BLT(metformin) Nausea (zofran) Back pain (Flexeril)  Past Medical History: Past Medical History:  Diagnosis Date  . Anxiety   . Asthma   . Depression   . Endometriosis   . Gestational diabetes   . [place]ubstance [place] ([place]   . Pregnancy induced hypertension   . Seizures ([place]   . SMOKER 11/02/2009   Qualifier: Diagnosis of  By: WMelvyn NovasMD, MChristena Deem    Past Surgical History: Past Surgical History:  Procedure Laterality Date  . DIAGNOSTIC [place]COPY  2012   [place]    Obstetrical History: OB History    Gravida  1   Para  0   Term  0   Preterm  0   AB  0   Living  0     SAB  0   TAB  0   Ectopic  0   Multiple      Live Births              Social History: Social History   Socioeconomic History  . Marital status: Divorced    Spouse name: Not on file  . Number of children: Not on file  . Years of education: Not on file  . Highest education level: Not on file  Occupational History  . Not on file  Tobacco Use  . Smoking status: Former Smoker    Packs/day: 1.50    Years: 9.00    Pack years: 13.50    Types: Cigarettes  . Smokeless tobacco: Never Used  . Tobacco comment: reports quitting 5 years ago  Substance and Sexual Activity  . Alcohol use: No    Comment: social drinker only  . Drug use: Not Currently    Types: Cocaine, Marijuana, Oxycodone, Benzodiazepines, Heroin, Hydrocodone    Comment: pt reports last use  is 5 years ago  . Sexual activity: Yes    Birth control/protection: Condom    Comment: heroin  Other Topics Concern  . Not on file  Social History Narrative   ** Merged History Encounter **       Social Determinants of Health   Financial Resource Strain:   . Difficulty of Paying Living Expenses: Not on file  Food Insecurity: No Food Insecurity  . Worried About RCharity fundraiserin the Last Year: Never true  . Ran Out of Food in the Last Year: Never true  Transportation Needs: No Transportation Needs  . Lack of Transportation (Medical): No  . Lack of Transportation (Non-Medical): No  Physical Activity:   . Days of Exercise per Week: Not on file  . Minutes of Exercise per Session: Not on file  Stress:   . Feeling of Stress : Not on file  Social Connections:   . Frequency of Communication with Friends and Family: Not on file  . Frequency of Social Gatherings with Friends and Family: Not on file  . Attends Religious Services: Not on file  . Active Member of Clubs or Organizations: Not on file  . Attends Club  or Organization Meetings: Not on file  . Marital Status: Not on file    Family History: Family History  Problem Relation Age of Onset  . Asthma Father   . Hypertension Father   . Diabetes Mellitus II Father   . Migraines Mother     Allergies: No Known Allergies  Medications Prior to Admission  Medication Sig Dispense Refill Last Dose  . acetaminophen (TYLENOL) 500 MG tablet Take 1,000 mg by mouth every 6 (six) hours as needed for headache. Reported on 11/16/2015     . AMBULATORY NON FORMULARY MEDICATION 1 Device by Other route once a week. Blood pressure cuff/ regular Monitored regularly at home ICD 10: Z34.90 1 kit 0   . cyclobenzaprine (FLEXERIL) 10 MG tablet Take 0.5-1 tablets (5-10 mg total) by mouth 3 (three) times daily as needed for muscle spasms. 30 tablet 1   . glucose blood (ACCU-CHEK GUIDE) test strip Check Blood Sugars 4 times a day, Fasting and 2  hours after breakfast, Lunch and Dinner 100 each 12   . glucose blood test strip Test Blood Glucose 4 x a day. Fasting and 2 hours after breakfast, lunch and dinner. (Patient not taking: Reported on 09/04/2019) 100 each 12   . loratadine (CLARITIN) 10 MG tablet Take 10 mg by mouth daily.     . metFORMIN (GLUCOPHAGE) 500 MG tablet 1049m with breakfast and before bed 30 tablet 2   . ondansetron (ZOFRAN-ODT) 4 MG disintegrating tablet Take 1 tablet (4 mg total) by mouth every 6 (six) hours as needed for nausea or vomiting. 30 tablet 2   . prenatal vitamin w/FE, FA (PRENATAL 1 + 1) 27-1 MG TABS tablet Take 1 tablet by mouth daily at 12 noon. 30 each 11   . sennosides-docusate sodium (SENOKOT-S) 8.6-50 MG tablet Take 1 tablet by mouth daily. 60 tablet 2      Review of Systems:  All systems reviewed and negative except as stated in HPI  PE: Blood pressure 127/79, pulse 89, temperature 98.1 F (36.7 C), temperature source Oral, resp. rate 18, height 5' 5"  (1.651 m), weight 87.7 kg, last menstrual period 12/18/2018. General appearance: alert, cooperative and appears stated age Lungs: regular rate and effort Heart: regular rate  Abdomen: soft, non-tender Extremities: Homans sign is negative, no sign of DVT Presentation: cephalic EFM: 1543bpm, moderate variability, positive accels, no decels Toco: uterine irritibility Dilation: 1 Effacement (%): Thick Station: -3 Exam by:: MSharyn Lull RN   Prenatal labs: ABO, Rh: --/--/O POS (12/15 0805) Antibody: NEG (12/15 0805) Rubella: <0.90 (09/28 0833) RPR: Non Reactive (09/28 0833)  HBsAg: Negative (09/28 06067  HIV: Non Reactive (09/28 07034  GBS: --/[place](11/23 1119)  2 hr GTT 98/186/127  Prenatal Transfer Tool  Maternal Diabetes: Yes:  Diabetes Type:  Insulin/Medication controlled Genetic Screening: Normal Maternal Ultrasounds/Referrals: Normal Fetal Ultrasounds or other Referrals:  Referred to Materal Fetal Medicine  Maternal Substance  Abuse:  No Significant Maternal Medications:  Metformin Significant Maternal Lab Results: Group B Strep negative  Results for orders placed or performed during the hospital encounter of 09/17/19 (from the past 24 hour(s))  CBC   Collection Time: 09/17/19  8:05 AM  Result Value Ref Range   WBC 9.2 4.0 - 10.5 K/uL   RBC 4.67 3.87 - 5.11 MIL/uL   Hemoglobin 12.3 12.0 - 15.0 g/dL   HCT 38.3 36.0 - 46.0 %   MCV 82.0 80.0 - 100.0 fL   MCH 26.3 26.0 - 34.0 pg   MCHC  32.1 30.0 - 36.0 g/dL   RDW 14.3 11.5 - 15.5 %   Platelets 364 150 - 400 K/uL   nRBC 0.0 0.0 - 0.2 %  Type and screen   Collection Time: 09/17/19  8:05 AM  Result Value Ref Range   ABO/RH(D) O POS    Antibody Screen NEG    Sample Expiration      09/20/2019,2359 Performed at [facility], [place] [address], [place], [place] [address]   Glucose, capillary   Collection Time: 09/17/19  8:23 AM  Result Value Ref Range   Glucose-Capillary 86 70 - 99 mg/dL    Patient Active Problem List   Diagnosis Date Noted  . Indication for care in labor and delivery, antepartum 09/17/2019  . Rubella non-immune status, antepartum 09/11/2019  . Trichomoniasis 09/04/2019  . Gestational diabetes 07/03/2019  . Loud snoring 06/04/2019  . Family history of alpha thalassemia 06/04/2019  . Alpha thalassemia silent carrier 06/04/2019  . Supervision of high risk pregnancy, antepartum 03/25/2019  . Major depressive disorder, recurrent, severe without psychotic features ([place])   . Major depressive disorder, recurrent episode, severe ([place]) 04/18/2015  . Generalized anxiety disorder 04/18/2015  . ASTHMA 11/02/2009  . HEADACHE, CHRONIC 11/02/2009    Assessment: Lanelle Lindo is a 28 y.o. G1P0000 at 80w0dhere for IOL 2/2 A2GDM  1. Labor: Start with cytotec for cervical ripening. Consider FB at next check. 2. FWB: Cat 1 tracing, EFW 3200 g 3. Pain: As desired, planning epidural 4. GBS: negative   Plan: Risks and benefits of induction  were reviewed, including failure of method, prolonged labor, need for further intervention, risk of cesarean.  Patient and family seem to understand these risks and wish to proceed. Options of cytotec, foley bulb, AROM, and pitocin reviewed, with use of each discussed.  Anticipate vaginal delivery  Jasyn Mey L Tache Bobst, DO  09/17/2019, 9:23 AM

## 2019-09-17 NOTE — Progress Notes (Addendum)
Caroline Gaines is a 28 y.o. G1P0000 at [redacted]w[redacted]d admitted for induction of labor due to Gestational diabetes, A2 on metformin.  Subjective: Called to room for prolonged decel.   Objective: BP 107/63   Pulse 78   Temp 97.9 F (36.6 C) (Oral)   Resp 16   Ht 5\' 5"  (1.651 m)   Wt 87.7 kg   LMP 12/18/2018 (Exact Date)   SpO2 100%   BMI 32.17 kg/m  No intake/output data recorded.  FHT:  FHR: 145 bpm, variability: moderate,  accelerations:  Present,  decelerations:  Variables and one prolonged decel UC: q2-24m  SVE:   Dilation: 7 Effacement (%): 90 Station: -1 Exam by:: dr.Khalif Stender  Labs: Lab Results  Component Value Date   WBC 11.7 (H) 09/17/2019   HGB 11.4 (L) 09/17/2019   HCT 36.0 09/17/2019   MCV 82.0 09/17/2019   PLT 372 09/17/2019    Assessment / Plan: 28 yo G1P0000 at 39.0 EGA here for IOL 2/2 A2GDM (on metformin)  Labor: S/p cyto x3 and FB. Pit stopped. Patient in hands and knees which resolved decel therefore terb not given. After several minutes, AROM with clear fluid and IUPC placed posteriorly to better monitor ctx. Will monitor MVU's and restart Pit as needed and as FHR will tolerate. Anticipate SVD. Fetal Wellbeing:  Category I Pain Control:  IV pain meds, epidural as desired A2GDM: last glucose 79 I/D:  negative Anticipated MOD:  vaginal, CS as appropriate  Barrington Ellison, MD [facility] Family Medicine Fellow, [facility] [place] [facility] 09/17/2019, 10:38 PM

## 2019-09-17 NOTE — Progress Notes (Signed)
Francy Mcilvaine is a 28 y.o. G1P0000 at [redacted]w[redacted]d admitted for induction of labor due to Gestational diabetes, A2 on metformin.  Subjective: Some light cramping, no contractions. Feeling baby move.  Objective: BP 107/72   Pulse 84   Temp 98.1 F (36.7 C) (Oral)   Resp 18   Ht 5\' 5"  (1.651 m)   Wt 87.7 kg   LMP 12/18/2018 (Exact Date)   BMI 32.17 kg/m  Total I/O In: 571.8 [P.O.:240; I.V.:331.8] Out: -   FHT:  FHR: 140 bpm, variability: moderate,  accelerations:  Present,  decelerations:  Absent UC:   none  SVE:   Dilation: 1.5 Effacement (%): 60 Station: -2 Exam by:: Sharyn Lull, RN   Labs: Lab Results  Component Value Date   WBC 9.2 09/17/2019   HGB 12.3 09/17/2019   HCT 38.3 09/17/2019   MCV 82.0 09/17/2019   PLT 364 09/17/2019    Assessment / Plan: 28 yo G1P0000 at 39.0 EGA here for IOL 2/2 A2GDM (on metformin)  Labor: S/p cyto x1. FB placed at this check without difficulty. 2nd cyto given buccally. Consider more cytotec depending on next cervical exam. Pitocin/AROM as appropriate. Fetal Wellbeing:  Category I Pain Control:  IV pain meds, epidural as desired A2GDM: CBGs q4h, last one 82, will be repeated. Q2h CBGs in active labor. I/D:  negative Anticipated MOD:  vaginal, CS as appropriate  Merilyn Baba DO OB Fellow, [facility] 09/17/2019, 1:08 PM

## 2019-09-17 NOTE — Progress Notes (Signed)
Caroline Gaines is a 28 y.o. G1P0000 at [redacted]w[redacted]d admitted for induction of labor due to Gestational diabetes, A2 on metformin.  Subjective: Feeling some abdominal pain and intermittent pressure but otherwise comfortable.  Objective: BP 107/63   Pulse 78   Temp 97.9 F (36.6 C) (Oral)   Resp 16   Ht 5\' 5"  (1.651 m)   Wt 87.7 kg   LMP 12/18/2018 (Exact Date)   SpO2 100%   BMI 32.17 kg/m  No intake/output data recorded.  FHT:  FHR: 145 bpm, variability: moderate,  accelerations:  Present,  decelerations:  Absent UC: q2-20m  SVE:   Dilation: 6 Effacement (%): 80 Station: -1 Exam by:: [employer] RN  Labs: Lab Results  Component Value Date   WBC 11.7 (H) 09/17/2019   HGB 11.4 (L) 09/17/2019   HCT 36.0 09/17/2019   MCV 82.0 09/17/2019   PLT 372 09/17/2019    Assessment / Plan: 28 yo G1P0000 at 39.0 EGA here for IOL 2/2 A2GDM (on metformin)  Labor: S/p cyto x3 and FB. Pit paused earlier due to ctx q17m, now spaced out some. Will restart. Anticipate SVD. Fetal Wellbeing:  Category I Pain Control:  IV pain meds, epidural as desired A2GDM: last glucose 79 I/D:  negative Anticipated MOD:  vaginal, CS as appropriate  Barrington Ellison, MD [facility] Family Medicine Fellow, [facility] for [place] [facility] 09/17/2019, 10:09 PM

## 2019-09-17 NOTE — Progress Notes (Signed)
Caroline Gaines is a 28 y.o. G1P0000 at 19w0dadmitted for induction of labor due to Gestational diabetes, A2 on metformin.  Subjective: Met patient. Now comfortable with epidural.   Objective: BP 119/66   Pulse 70   Temp 97.9 F (36.6 C) (Oral)   Resp 16   Ht 5' 5"  (1.651 m)   Wt 87.7 kg   LMP 12/18/2018 (Exact Date)   SpO2 98%   BMI 32.17 kg/m  No intake/output data recorded.  FHT:  FHR: 145 bpm, variability: moderate,  accelerations:  Present,  decelerations:  Absent UC:  irregular  SVE:   Dilation: 6 Effacement (%): 80 Station: -1 Exam by:: CClorox CompanyRN  Labs: Lab Results  Component Value Date   WBC 11.7 (H) 09/17/2019   HGB 11.4 (L) 09/17/2019   HCT 36.0 09/17/2019   MCV 82.0 09/17/2019   PLT 372 09/17/2019    Assessment / Plan: 28yo G1P0000 at 39.0 EGA here for IOL 2/2 A2GDM (on metformin)  Labor: S/p cyto x3 and FB. Will start Pit. Consider AROM at next exam. Anticipate SVD. Fetal Wellbeing:  Category I Pain Control:  IV pain meds, epidural as desired A2GDM: last glucose 79 I/D:  negative Anticipated MOD:  vaginal, CS as appropriate  CBarrington Ellison MD [facility] Fellow, [facility] [place] 09/17/2019, 8:28 PM

## 2019-09-17 NOTE — Anesthesia Preprocedure Evaluation (Signed)
Anesthesia Evaluation  Patient identified by MRN, date of birth, ID band Patient awake    Reviewed: Allergy & Precautions, NPO status , Patient's Chart, lab work & pertinent test results  History of Anesthesia Complications Negative for: history of anesthetic complications  Airway Mallampati: II   Neck ROM: Full    Dental   Pulmonary asthma , former smoker,    Pulmonary exam normal        Cardiovascular hypertension (PIH), Normal cardiovascular exam     Neuro/Psych  Headaches, Seizures -,  PSYCHIATRIC DISORDERS Anxiety Depression    GI/Hepatic negative GI ROS, (+)     substance abuse  ,   Endo/Other  diabetes, Gestational, Oral Hypoglycemic Agents Obesity   Renal/GU negative Renal ROS     Musculoskeletal negative musculoskeletal ROS (+)   Abdominal   Peds  Hematology  Plt 372k Alpha thalassemia carrier    Anesthesia Other Findings Covid neg 12/12   Reproductive/Obstetrics (+) Pregnancy                             Anesthesia Physical Anesthesia Plan  ASA: III  Anesthesia Plan: Epidural   Post-op Pain Management:    Induction:   PONV Risk Score and Plan: 2 and Treatment may vary due to age or medical condition  Airway Management Planned: Natural Airway  Additional Equipment: None  Intra-op Plan:   Post-operative Plan:   Informed Consent: I have reviewed the patients History and Physical, chart, labs and discussed the procedure including the risks, benefits and alternatives for the proposed anesthesia with the patient or authorized representative who has indicated his/her understanding and acceptance.       Plan Discussed with: Anesthesiologist  Anesthesia Plan Comments: (Labs reviewed. Platelets acceptable, patient not taking any blood thinning medications. Per RN, FHR tracing reported to be stable enough for sitting procedure. Risks and benefits discussed with  patient, including PDPH, backache, epidural hematoma, failed epidural, blood pressure changes, allergic reaction, and nerve injury. Patient expressed understanding and wished to proceed.)        Anesthesia Quick Evaluation

## 2019-09-18 ENCOUNTER — Encounter (HOSPITAL_COMMUNITY): Payer: Self-pay | Admitting: Student

## 2019-09-18 DIAGNOSIS — O24424 Gestational diabetes mellitus in childbirth, insulin controlled: Secondary | ICD-10-CM

## 2019-09-18 DIAGNOSIS — Z3A39 39 weeks gestation of pregnancy: Secondary | ICD-10-CM

## 2019-09-18 LAB — CBC
HCT: 32.8 % — ABNORMAL LOW (ref 36.0–46.0)
Hemoglobin: 10.4 g/dL — ABNORMAL LOW (ref 12.0–15.0)
MCH: 26.3 pg (ref 26.0–34.0)
MCHC: 31.7 g/dL (ref 30.0–36.0)
MCV: 82.8 fL (ref 80.0–100.0)
Platelets: 325 10*3/uL (ref 150–400)
RBC: 3.96 MIL/uL (ref 3.87–5.11)
RDW: 14.2 % (ref 11.5–15.5)
WBC: 13.5 10*3/uL — ABNORMAL HIGH (ref 4.0–10.5)
nRBC: 0 % (ref 0.0–0.2)

## 2019-09-18 LAB — GLUCOSE, CAPILLARY: Glucose-Capillary: 71 mg/dL (ref 70–99)

## 2019-09-18 MED ORDER — OXYCODONE HCL 5 MG PO TABS
5.0000 mg | ORAL_TABLET | ORAL | Status: DC | PRN
  Administered 2019-09-18: 5 mg via ORAL
  Filled 2019-09-18: qty 1

## 2019-09-18 MED ORDER — MEASLES, MUMPS & RUBELLA VAC IJ SOLR
0.5000 mL | Freq: Once | INTRAMUSCULAR | Status: AC
  Administered 2019-09-19: 0.5 mL via SUBCUTANEOUS
  Filled 2019-09-18: qty 0.5

## 2019-09-18 MED ORDER — SENNOSIDES-DOCUSATE SODIUM 8.6-50 MG PO TABS
2.0000 | ORAL_TABLET | ORAL | Status: DC
  Administered 2019-09-18: 2 via ORAL
  Filled 2019-09-18: qty 2

## 2019-09-18 MED ORDER — IBUPROFEN 600 MG PO TABS
600.0000 mg | ORAL_TABLET | Freq: Three times a day (TID) | ORAL | Status: DC | PRN
  Administered 2019-09-18 (×2): 600 mg via ORAL
  Filled 2019-09-18 (×2): qty 1

## 2019-09-18 MED ORDER — LACTATED RINGERS AMNIOINFUSION: INTRAVENOUS | Status: DC

## 2019-09-18 MED ORDER — ACETAMINOPHEN 325 MG PO TABS
650.0000 mg | ORAL_TABLET | Freq: Four times a day (QID) | ORAL | Status: DC | PRN
  Administered 2019-09-18 – 2019-09-19 (×3): 650 mg via ORAL
  Filled 2019-09-18 (×4): qty 2

## 2019-09-18 MED ORDER — DIBUCAINE (PERIANAL) 1 % EX OINT: 1.0000 "application " | TOPICAL_OINTMENT | CUTANEOUS | Status: DC | PRN

## 2019-09-18 MED ORDER — WITCH HAZEL-GLYCERIN EX PADS: 1.0000 "application " | MEDICATED_PAD | CUTANEOUS | Status: DC | PRN

## 2019-09-18 MED ORDER — ONDANSETRON HCL 4 MG/2ML IJ SOLN: 4.0000 mg | INTRAMUSCULAR | Status: DC | PRN

## 2019-09-18 MED ORDER — ONDANSETRON HCL 4 MG PO TABS: 4.0000 mg | ORAL_TABLET | ORAL | Status: DC | PRN

## 2019-09-18 MED ORDER — DIPHENHYDRAMINE HCL 25 MG PO CAPS: 25.0000 mg | ORAL_CAPSULE | Freq: Four times a day (QID) | ORAL | Status: DC | PRN

## 2019-09-18 MED ORDER — SIMETHICONE 80 MG PO CHEW: 80.0000 mg | CHEWABLE_TABLET | ORAL | Status: DC | PRN

## 2019-09-18 MED ORDER — BENZOCAINE-MENTHOL 20-0.5 % EX AERO
1.0000 "application " | INHALATION_SPRAY | CUTANEOUS | Status: DC | PRN
  Administered 2019-09-18: 1 via TOPICAL
  Filled 2019-09-18: qty 56

## 2019-09-18 MED ORDER — TETANUS-DIPHTH-ACELL PERTUSSIS 5-2.5-18.5 LF-MCG/0.5 IM SUSP: 0.5000 mL | Freq: Once | INTRAMUSCULAR | Status: DC

## 2019-09-18 MED ORDER — PRENATAL MULTIVITAMIN CH
1.0000 | ORAL_TABLET | Freq: Every day | ORAL | Status: DC
  Administered 2019-09-18 – 2019-09-19 (×2): 1 via ORAL
  Filled 2019-09-18 (×2): qty 1

## 2019-09-18 MED ORDER — COCONUT OIL OIL: 1.0000 "application " | TOPICAL_OIL | Status: DC | PRN

## 2019-09-18 NOTE — Progress Notes (Signed)
Alvah Gilder is a 28 y.o. G1P0000 at [redacted]w[redacted]d admitted for induction of labor due to Gestational diabetes, A2 on metformin.  Subjective: Went to room for another prolonged decel. Patient continues to report feeling a little lightheaded.   Objective: BP 100/62   Pulse (!) 168   Temp 98.6 F (37 C) (Oral)   Resp 16   Ht 5\' 5"  (1.651 m)   Wt 87.7 kg   LMP 12/18/2018 (Exact Date)   SpO2 100%   BMI 32.17 kg/m  Total I/O In: -  Out: 900 [Urine:900]  FHT:  FHR: 135 bpm, variability: moderate,  accelerations:  Present,  decelerations:  Variables with ctx and another prolonged decel UC: q3-56m  SVE:   Dilation: 7 Effacement (%): 90 Station: 0 Exam by:: Dr. Marice Potter  Labs: Lab Results  Component Value Date   WBC 11.7 (H) 09/17/2019   HGB 11.4 (L) 09/17/2019   HCT 36.0 09/17/2019   MCV 82.0 09/17/2019   PLT 372 09/17/2019    Assessment / Plan: 28 yo G1P0000 at 39.1 EGA here for IOL 2/2 A2GDM (on metformin)  Labor: S/p cyto x3 and FB. Terb given. Dr. Harolyn Rutherford at bedside with me as well. We discussed c-section should prolonged decels persist. Offered c-section now and patient would like to continue to try for vaginal delivery at this time. FHR recovered with terb and position change. Will monitor closely.  Fetal Wellbeing:  Category II; reassuring for moderate variability and accels prior to prolonged decel  Pain Control:  IV pain meds, epidural as desired A2GDM: last glucose 76 I/D:  negative Anticipated MOD:  vaginal, CS as appropriate  Barrington Ellison, MD [facility] Family Medicine Fellow, [facility] for [place] 09/18/2019, 1:04 AM

## 2019-09-18 NOTE — Anesthesia Postprocedure Evaluation (Signed)
Anesthesia Post Note  Patient: Caroline Gaines  Procedure(s) Performed: AN AD [place]     Patient location during evaluation: Mother Baby Anesthesia Type: Epidural Level of consciousness: awake and alert Pain management: pain level controlled Vital Signs Assessment: post-procedure vital signs reviewed and stable Respiratory status: spontaneous breathing, nonlabored ventilation and respiratory function stable Cardiovascular status: stable Postop Assessment: no headache, no backache and epidural receding Anesthetic complications: no    Last Vitals:  Vitals:   09/18/19 0730 09/18/19 0840  BP: 100/70 105/64  Pulse: 80 82  Resp: 16 18  Temp: 36.7 C 36.7 C  SpO2: 100% 100%    Last Pain:  Vitals:   09/18/19 1405  TempSrc:   PainSc: 2    Pain Goal:                   Fendi Meinhardt

## 2019-09-18 NOTE — Progress Notes (Signed)
Caroline Gaines is a 28 y.o. G1P0000 at [redacted]w[redacted]d admitted for induction of labor due to Gestational diabetes, A2 on metformin.  Subjective: Went to room for another prolonged decel. Patient reports feeling a little lightheaded.   Objective: BP (!) 99/47   Pulse 67   Temp 98.6 F (37 C) (Oral)   Resp 18   Ht 5\' 5"  (1.651 m)   Wt 87.7 kg   LMP 12/18/2018 (Exact Date)   SpO2 99%   BMI 32.17 kg/m  Total I/O In: -  Out: 900 [Urine:900]  FHT:  FHR: 135 bpm, variability: moderate,  accelerations:  Present,  decelerations:  Variables with ctx and one prolonged decel UC: q3-4m  SVE:   Dilation: 7 Effacement (%): 90 Station: 0 Exam by:: Dr. Marice Potter  Labs: Lab Results  Component Value Date   WBC 11.7 (H) 09/17/2019   HGB 11.4 (L) 09/17/2019   HCT 36.0 09/17/2019   MCV 82.0 09/17/2019   PLT 372 09/17/2019    Assessment / Plan: 28 yo G1P0000 at 39.0 EGA here for IOL 2/2 A2GDM (on metformin)  Labor: S/p cyto x3 and FB. Decel resolved with position change, still variables with ctx; will trial amnioinfusion. Minimal cervical change although station did improve. MVU's ~ 190. Repeat SVE in 2 hours.  Fetal Wellbeing:  Category II; reassuring for moderate variability and accels prior to prolonged decel  Pain Control:  IV pain meds, epidural as desired A2GDM: last glucose 76 I/D:  negative Anticipated MOD:  vaginal, CS as appropriate  Barrington Ellison, MD [facility] Family Medicine Fellow, [facility] for [place] [facility] 09/18/2019, 12:15 AM

## 2019-09-18 NOTE — Progress Notes (Signed)
Caroline Gaines is a 28 y.o. G1P0000 at [redacted]w[redacted]d admitted for induction of labor due to Gestational diabetes, A2 on metformin.  Subjective: Went to room for another prolonged decel. Patient continues to report feeling a little lightheaded.   Objective: BP (!) 95/55   Pulse 67   Temp 98.6 F (37 C) (Oral)   Resp 16   Ht 5\' 5"  (1.651 m)   Wt 87.7 kg   LMP 12/18/2018 (Exact Date)   SpO2 100%   BMI 32.17 kg/m  Total I/O In: -  Out: 900 [Urine:900]  FHT:  FHR: 135 bpm, variability: moderate,  accelerations:  Present,  decelerations:  Variables with ctx and another prolonged decel UC: q3-48m  SVE:   Dilation: 7 Effacement (%): 90 Station: 0 Exam by:: Dr. Marice Potter  Labs: Lab Results  Component Value Date   WBC 11.7 (H) 09/17/2019   HGB 11.4 (L) 09/17/2019   HCT 36.0 09/17/2019   MCV 82.0 09/17/2019   PLT 372 09/17/2019    Assessment / Plan: 28 yo G1P0000 at 39.1 EGA here for IOL 2/2 A2GDM (on metformin)  Labor: S/p cyto x3 and FB. Terb given. Dr. Harolyn Rutherford at bedside with me as well. We discussed c-section should prolonged decels persist. FHR recovered with terb and position change. Will monitor closely.  Fetal Wellbeing:  Category II; reassuring for moderate variability and accels prior to prolonged decel  Pain Control:  IV pain meds, epidural as desired A2GDM: last glucose 76 I/D:  negative Anticipated MOD:  vaginal, CS as appropriate  Barrington Ellison, MD [facility] Family Medicine Fellow, [facility] for [place] 09/18/2019, 12:51 AM

## 2019-09-18 NOTE — Discharge Summary (Signed)
Postpartum Discharge Summary     Patient Name: Caroline Gaines DOB: Jul 23, 1991 MRN: [id number]  Date of admission: 09/17/2019 Delivering Provider: Chauncey Mann   Date of discharge: 09/19/2019  Admitting diagnosis: Indication for care in labor and delivery, antepartum [O75.9] Intrauterine pregnancy: [redacted]w[redacted]d    Secondary diagnosis:  Active Problems:   Major depressive disorder, recurrent episode, severe ([place]   Generalized anxiety disorder   Gestational diabetes   Rubella non-immune status, antepartum   Indication for care in labor and delivery, antepartum  Additional problems: None     Discharge diagnosis: Term Pregnancy Delivered and GDM A2                                                                                                Post partum procedures:None  Augmentation: AROM, Pitocin, Cytotec and Foley Balloon  Complications: None  Hospital course:  Induction of Labor With Vaginal Delivery   28y.o. yo G1P0000 at 387w1das admitted to the hospital 09/17/2019 for induction of labor.  Indication for induction: A2 DM.  Patient had an uncomplicated labor course as follows: Initial SVE: 1/thick/-3. Patient received Cytotec, Foley bulb, AROM and Pitocin. Received epidural. She then progressed to complete.  Membrane Rupture Time/Date: 10:31 PM ,09/17/2019   Intrapartum Procedures: Episiotomy: None [1]                                         Lacerations:  Labial [10];Periurethral [8]  Patient had delivery of a Viable infant.  Information for the patient's newborn:  YoZyniah, Ferraiolo0[[id number]]Delivery Method: Vaginal, Spontaneous(Filed from Delivery Summary)    09/18/2019  Details of delivery can be found in separate delivery note.  Patient had a routine postpartum course. Fasting AM glucose 71. POP's prescribed on discharge. Patient is discharged home 09/19/19. Delivery time: 4:50 AM    Magnesium Sulfate received: No BMZ received: No Rhophylac:No MMR:Yes;  ordered prior to DC Transfusion:No  Physical exam  Vitals:   09/18/19 0730 09/18/19 0840 09/18/19 2021 09/19/19 0527  BP: 100/70 105/64 118/76 108/73  Pulse: 80 82 78 74  Resp: 16 18 16 18   Temp: 98 F (36.7 C) 98.1 F (36.7 C) 98.2 F (36.8 C) 98.3 F (36.8 C)  TempSrc: Oral Oral Oral Oral  SpO2: 100% 100% 100% 100%  Weight:      Height:       General: alert, cooperative and no distress Lochia: appropriate Uterine Fundus: firm DVT Evaluation: No evidence of DVT seen on physical exam. Labs: Lab Results  Component Value Date   WBC 13.5 (H) 09/18/2019   HGB 10.4 (L) 09/18/2019   HCT 32.8 (L) 09/18/2019   MCV 82.8 09/18/2019   PLT 325 09/18/2019   CMP Latest Ref Rng & Units 06/04/2019  Glucose 65 - 99 mg/dL 99  BUN 6 - 20 mg/dL 6  Creatinine 0.57 - 1.00 mg/dL 0.48(L)  Sodium 134 - 144 mmol/L 137  Potassium 3.5 - 5.2 mmol/L 4.4  Chloride 96 -  106 mmol/L 103  CO2 20 - 29 mmol/L 20  Calcium 8.7 - 10.2 mg/dL 8.7  Total Protein 6.0 - 8.5 g/dL 6.3  Total Bilirubin 0.0 - 1.2 mg/dL <0.2  Alkaline Phos 39 - 117 IU/L 61  AST 0 - 40 IU/L 14  ALT 0 - 32 IU/L 17    Discharge instruction: per After Visit Summary and "Baby and Me Booklet".  After visit meds:  Allergies as of 09/19/2019   No Known Allergies     Medication List    STOP taking these medications   Accu-Chek Guide test strip Generic drug: glucose blood   AMBULATORY NON FORMULARY MEDICATION   cyclobenzaprine 10 MG tablet Commonly known as: FLEXERIL   glucose blood test strip   metFORMIN 500 MG tablet Commonly known as: Glucophage   ondansetron 4 MG disintegrating tablet Commonly known as: ZOFRAN-ODT     TAKE these medications   acetaminophen 500 MG tablet Commonly known as: TYLENOL Take 1,000 mg by mouth every 6 (six) hours as needed for headache. Reported on 11/16/2015   benzocaine-Menthol 20-0.5 % Aero Commonly known as: DERMOPLAST Apply 1 application topically as needed for irritation  (perineal discomfort).   dibucaine 1 % Oint Commonly known as: NUPERCAINAL Place 1 application rectally as needed for hemorrhoids.   ibuprofen 600 MG tablet Commonly known as: ADVIL Take 1 tablet (600 mg total) by mouth every 6 (six) hours as needed for mild pain.   loratadine 10 MG tablet Commonly known as: CLARITIN Take 10 mg by mouth daily.   norethindrone 0.35 MG tablet Commonly known as: MICRONOR Take 1 tablet (0.35 mg total) by mouth daily.   polyethylene glycol 17 g packet Commonly known as: MIRALAX / GLYCOLAX Take 17 g by mouth daily.   prenatal vitamin w/FE, FA 27-1 MG Tabs tablet Take 1 tablet by mouth daily at 12 noon.   sennosides-docusate sodium 8.6-50 MG tablet Commonly known as: SENOKOT-S Take 1 tablet by mouth daily.   witch hazel-glycerin pad Commonly known as: TUCKS Apply 1 application topically as needed for hemorrhoids.       Diet: routine diet  Activity: Advance as tolerated. Pelvic rest for 6 weeks.   Outpatient follow up:4 weeks Follow up Appt: Future Appointments  Date Time Provider [place]  10/28/2019  8:15 AM Dovray Platter  10/30/2019  8:20 AM [facility]  10/30/2019  9:35 AM Hillard Danker Myles Rosenthal, PA-C [facility]   Follow up Visit:   Please schedule this patient for Postpartum visit in: 4 weeks with the following provider: Any provider High risk pregnancy complicated by: GDM Delivery mode:  SVD Anticipated Birth Control:  POPs PP Procedures needed: 2 hour GTT  Schedule Integrated BH visit: yes      Newborn Data: Live born female  Birth Weight: 2900g  APGAR: 26, 9  Newborn Delivery   Birth date/time: 09/18/2019 04:50:00 Delivery type: Vaginal, Spontaneous      Baby Feeding: Bottle Disposition:home with mother   09/19/2019 Chauncey Mann, MD

## 2019-09-18 NOTE — Progress Notes (Signed)
Now Cat I FHT with ctx q26m. Will restart Pit at 2.   Barrington Ellison, MD [facility] Family Medicine Fellow, [facility] for [employer], [place]

## 2019-09-19 LAB — GLUCOSE, CAPILLARY: Glucose-Capillary: 80 mg/dL (ref 70–99)

## 2019-09-19 MED ORDER — POLYETHYLENE GLYCOL 3350 17 G PO PACK: 17.0000 g | PACK | Freq: Every day | ORAL | Status: DC

## 2019-09-19 MED ORDER — IBUPROFEN 600 MG PO TABS: 600.0000 mg | ORAL_TABLET | Freq: Four times a day (QID) | ORAL | 0 refills | Status: DC | PRN

## 2019-09-19 MED ORDER — WITCH HAZEL-GLYCERIN EX PADS: 1.0000 "application " | MEDICATED_PAD | CUTANEOUS | 12 refills | Status: DC | PRN

## 2019-09-19 MED ORDER — BENZOCAINE-MENTHOL 20-0.5 % EX AERO: 1.0000 "application " | INHALATION_SPRAY | CUTANEOUS | 0 refills | Status: DC | PRN

## 2019-09-19 MED ORDER — DIBUCAINE (PERIANAL) 1 % EX OINT: 1.0000 "application " | TOPICAL_OINTMENT | CUTANEOUS | 0 refills | Status: DC | PRN

## 2019-09-19 MED ORDER — POLYETHYLENE GLYCOL 3350 17 G PO PACK: 17.0000 g | PACK | Freq: Every day | ORAL | 0 refills | Status: DC

## 2019-09-19 MED ORDER — NORETHINDRONE 0.35 MG PO TABS: 1.0000 | ORAL_TABLET | Freq: Every day | ORAL | 11 refills | Status: DC

## 2019-09-19 NOTE — Progress Notes (Signed)
Post Partum Day 1 Subjective: Patient reports feeling well. She is tolerating PO. Ambulating and urinating without difficulty. Lochia minimal. She would like to discharge today if baby is able to discharge. Reports some cramping for which she has taken Oxycodone. Requests something for constipation.   Objective: Blood pressure 108/73, pulse 74, temperature 98.3 F (36.8 C), temperature source Oral, resp. rate 18, height 5\' 5"  (1.651 m), weight 87.7 kg, last menstrual period 12/18/2018, SpO2 100 %, unknown if currently breastfeeding.  Physical Exam:  General: alert, cooperative and appears stated age 28: appropriate Uterine Fundus: firm DVT Evaluation: No evidence of DVT seen on physical exam.  Recent Labs    09/17/19 1808 09/18/19 0600  HGB 11.4* 10.4*  HCT 36.0 32.8*    Assessment/Plan: Discharge home; okay to cancel DC orders if baby stays; also discussed option of mom to room-in instead if she desires POP's to be prescribed on discharge Fasting AM glucose 71 Formula feeding Vitals stable   LOS: 2 days   Chauncey Mann 09/19/2019, 6:49 AM
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
## 2019-09-20 ENCOUNTER — Encounter: Payer: Medicaid Other | Admitting: Obstetrics and Gynecology

## 2019-09-25 ENCOUNTER — Encounter: Payer: Medicaid Other | Admitting: Family Medicine

## 2019-09-30 ENCOUNTER — Encounter: Payer: Medicaid Other | Admitting: Family Medicine

## 2019-10-03 ENCOUNTER — Telehealth: Payer: Self-pay | Admitting: *Deleted

## 2019-10-03 NOTE — Telephone Encounter (Signed)
Caroline Gaines called and left a message this am that she has some questions for her job about delivery.  I called Caroline Gaines and she said her work is faxing an ADA form about the 2 days she was off before her induction because she was out because her contractions were worse and she was having a lot of nausea. I explained we don't normally give a note if you were out and the doctor did not take you out of work/ and or we did not see you in the office. She states Dr.Dove did her accomodations before and told her if she needed anymore to let her know. She asked that I route to Dr.Dove for approval. I informed her I can do that and also will route to the person in our office who does the paperwork. She agrees with plan of care and voices understanding. Caroline Mefferd,RN

## 2019-10-09 ENCOUNTER — Ambulatory Visit: Payer: BC Managed Care – PPO

## 2019-10-11 ENCOUNTER — Ambulatory Visit: Payer: BC Managed Care – PPO

## 2019-10-17 ENCOUNTER — Encounter: Payer: Self-pay | Admitting: *Deleted

## 2019-10-17 NOTE — BH Specialist Note (Signed)
Pt did not arrive to video visit and did not answer the phone ; Left HIPPA-compliant message to call back Asher Muir from [facility] at [phone].  ; left MyChart message for patient.   Integrated Behavioral Health via Telemedicine Video Visit  10/17/2019 Caroline Gaines [id number]  [name]

## 2019-10-24 ENCOUNTER — Other Ambulatory Visit: Payer: Self-pay | Admitting: Lactation Services

## 2019-10-24 DIAGNOSIS — O24415 Gestational diabetes mellitus in pregnancy, controlled by oral hypoglycemic drugs: Secondary | ICD-10-CM

## 2019-10-28 ENCOUNTER — Ambulatory Visit: Payer: BC Managed Care – PPO | Admitting: Clinical

## 2019-10-28 ENCOUNTER — Other Ambulatory Visit: Payer: Self-pay

## 2019-10-28 DIAGNOSIS — Z91199 Patient's noncompliance with other medical treatment and regimen due to unspecified reason: Secondary | ICD-10-CM

## 2019-10-28 DIAGNOSIS — Z5329 Procedure and treatment not carried out because of patient's decision for other reasons: Secondary | ICD-10-CM

## 2019-10-30 ENCOUNTER — Other Ambulatory Visit: Payer: BC Managed Care – PPO

## 2019-10-30 ENCOUNTER — Ambulatory Visit: Payer: BC Managed Care – PPO | Admitting: Medical

## 2019-10-31 ENCOUNTER — Ambulatory Visit (INDEPENDENT_AMBULATORY_CARE_PROVIDER_SITE_OTHER): Payer: BC Managed Care – PPO | Admitting: Obstetrics and Gynecology

## 2019-10-31 ENCOUNTER — Other Ambulatory Visit: Payer: BC Managed Care – PPO

## 2019-10-31 ENCOUNTER — Encounter: Payer: Self-pay | Admitting: Obstetrics and Gynecology

## 2019-10-31 ENCOUNTER — Other Ambulatory Visit: Payer: Self-pay

## 2019-10-31 DIAGNOSIS — Z3492 Encounter for supervision of normal pregnancy, unspecified, second trimester: Secondary | ICD-10-CM

## 2019-10-31 DIAGNOSIS — Z1389 Encounter for screening for other disorder: Secondary | ICD-10-CM

## 2019-10-31 DIAGNOSIS — Z3009 Encounter for other general counseling and advice on contraception: Secondary | ICD-10-CM | POA: Insufficient documentation

## 2019-10-31 DIAGNOSIS — O24415 Gestational diabetes mellitus in pregnancy, controlled by oral hypoglycemic drugs: Secondary | ICD-10-CM

## 2019-10-31 NOTE — Progress Notes (Signed)
Subjective:     Caroline Gaines is a 29 y.o. female who presents for a postpartum visit. She is 5 weeks postpartum following a SVD. I have fully reviewed the prenatal and intrapartum course. The delivery was at 39.1 gestational weeks. Outcome: spontaneous vaginal delivery. Anesthesia: epidural. Postpartum course has been unremarkable. Baby's course has been unremarkable. Baby is feeding by bottle - gerber Soy. Bleeding moderate lochia. Bowel function is normal. Bladder function is normal. Patient is sexually active. Contraception method is OCP (estrogen/progesterone). Postpartum depression screening: Negative.  The following portions of the patient's history were reviewed and updated as appropriate: allergies, current medications, past family history, past medical history, past social history, past surgical history and problem list.  Review of Systems Pertinent items are noted in HPI.   Objective:    BP 120/75   Pulse 85   Wt 175 lb (79.4 kg)   BMI 29.12 kg/m   General:  alert  Lungs: clear to auscultation bilaterally  Heart:  regular rate and rhythm, S1, S2 normal, no murmur, click, rub or gallop        Assessment:   Normal postpartum exam. Pap smear not done at today's visit.  Last pap done 04/08/2019 GDM HX  Plan:   1. Contraception: none Desires to return for IUD placement.  2. 2 hour GTT today.  3. Follow up in: 1 week or as needed.     Venia Carbon I, NP  10/31/2019 3:02 PM

## 2019-11-01 LAB — GLUCOSE TOLERANCE, 2 HOURS
Glucose, 2 hour: 80 mg/dL (ref 65–139)
Glucose, GTT - Fasting: 91 mg/dL (ref 65–99)

## 2019-11-05 ENCOUNTER — Encounter: Payer: Self-pay | Admitting: Advanced Practice Midwife

## 2019-11-14 ENCOUNTER — Ambulatory Visit: Payer: BC Managed Care – PPO | Admitting: Obstetrics and Gynecology

## 2019-11-24 ENCOUNTER — Other Ambulatory Visit: Payer: Self-pay

## 2019-11-24 ENCOUNTER — Emergency Department (HOSPITAL_COMMUNITY)
Admission: EM | Admit: 2019-11-24 | Discharge: 2019-11-24 | Disposition: A | Discharge status: Home / Self Care | Payer: Medicaid Other | Attending: Emergency Medicine | Admitting: Emergency Medicine

## 2019-11-24 ENCOUNTER — Encounter (HOSPITAL_COMMUNITY): Payer: Self-pay | Admitting: Emergency Medicine

## 2019-11-24 ENCOUNTER — Emergency Department (HOSPITAL_COMMUNITY): Payer: Medicaid Other

## 2019-11-24 DIAGNOSIS — Z87891 Personal history of nicotine dependence: Secondary | ICD-10-CM | POA: Diagnosis not present

## 2019-11-24 DIAGNOSIS — Z79899 Other long term (current) drug therapy: Secondary | ICD-10-CM | POA: Diagnosis not present

## 2019-11-24 DIAGNOSIS — Z20822 Contact with and (suspected) exposure to covid-19: Secondary | ICD-10-CM | POA: Insufficient documentation

## 2019-11-24 DIAGNOSIS — R509 Fever, unspecified: Secondary | ICD-10-CM | POA: Diagnosis present

## 2019-11-24 DIAGNOSIS — J069 Acute upper respiratory infection, unspecified: Secondary | ICD-10-CM | POA: Insufficient documentation

## 2019-11-24 LAB — URINALYSIS, ROUTINE W REFLEX MICROSCOPIC
Bilirubin Urine: NEGATIVE
Glucose, UA: NEGATIVE mg/dL
Ketones, ur: NEGATIVE mg/dL
Leukocytes,Ua: NEGATIVE
Nitrite: NEGATIVE
Protein, ur: NEGATIVE mg/dL
Specific Gravity, Urine: 1.025 (ref 1.005–1.030)
pH: 6 (ref 5.0–8.0)

## 2019-11-24 LAB — URINALYSIS, MICROSCOPIC (REFLEX): WBC, UA: NONE SEEN WBC/hpf (ref 0–5)

## 2019-11-24 LAB — COMPREHENSIVE METABOLIC PANEL
ALT: 22 U/L (ref 0–44)
AST: 20 U/L (ref 15–41)
Albumin: 3.8 g/dL (ref 3.5–5.0)
Alkaline Phosphatase: 57 U/L (ref 38–126)
Anion gap: 10 (ref 5–15)
BUN: 8 mg/dL (ref 6–20)
CO2: 21 mmol/L — ABNORMAL LOW (ref 22–32)
Calcium: 8.4 mg/dL — ABNORMAL LOW (ref 8.9–10.3)
Chloride: 105 mmol/L (ref 98–111)
Creatinine, Ser: 0.7 mg/dL (ref 0.44–1.00)
GFR calc Af Amer: >60 mL/min (ref >60)
GFR calc non Af Amer: >60 mL/min (ref >60)
Glucose, Bld: 164 mg/dL — ABNORMAL HIGH (ref 70–99)
Potassium: 3 mmol/L — ABNORMAL LOW (ref 3.5–5.1)
Sodium: 136 mmol/L (ref 135–145)
Total Bilirubin: 0.5 mg/dL (ref 0.3–1.2)
Total Protein: 6.8 g/dL (ref 6.5–8.1)

## 2019-11-24 LAB — I-STAT BETA HCG BLOOD, ED (MC, WL, AP ONLY): I-stat hCG, quantitative: <5 m[IU]/mL (ref <5)

## 2019-11-24 LAB — CBC
HCT: 38.6 % (ref 36.0–46.0)
Hemoglobin: 11.9 g/dL — ABNORMAL LOW (ref 12.0–15.0)
MCH: 25.5 pg — ABNORMAL LOW (ref 26.0–34.0)
MCHC: 30.8 g/dL (ref 30.0–36.0)
MCV: 82.8 fL (ref 80.0–100.0)
Platelets: 341 10*3/uL (ref 150–400)
RBC: 4.66 MIL/uL (ref 3.87–5.11)
RDW: 13.6 % (ref 11.5–15.5)
WBC: 8 10*3/uL (ref 4.0–10.5)
nRBC: 0 % (ref 0.0–0.2)

## 2019-11-24 LAB — POC SARS CORONAVIRUS 2 AG -  ED: SARS Coronavirus 2 Ag: NEGATIVE

## 2019-11-24 LAB — LIPASE, BLOOD: Lipase: 27 U/L (ref 11–51)

## 2019-11-24 MED ORDER — ONDANSETRON 4 MG PO TBDP
4.0000 mg | ORAL_TABLET | Freq: Once | ORAL | Status: AC
  Administered 2019-11-24: 4 mg via ORAL
  Filled 2019-11-24: qty 1

## 2019-11-24 MED ORDER — ONDANSETRON 4 MG PO TBDP: 4.0000 mg | ORAL_TABLET | Freq: Three times a day (TID) | ORAL | 0 refills | Status: DC | PRN

## 2019-11-24 MED ORDER — ACETAMINOPHEN 325 MG PO TABS
650.0000 mg | ORAL_TABLET | Freq: Once | ORAL | Status: AC
  Administered 2019-11-24: 650 mg via ORAL
  Filled 2019-11-24: qty 2

## 2019-11-24 NOTE — ED Triage Notes (Signed)
Patient c/o fever, chest congestion, runny nose, generalized body aches and emesis x 2 days. Took 2 tylenol around noon today. Patient was able to tolerate sprite and pretzels PTA. Patient adds she was recently exposed to her niece who had cold symptoms.

## 2019-11-24 NOTE — ED Notes (Signed)
SARS Coronavirus 2 Ag NEGATIVE [Ref Range: NEGATIVE] ((NOTE)  SARS-CoV-2 antigen NOT DETECTED. Reported to [employer] PA

## 2019-11-24 NOTE — ED Provider Notes (Signed)
[facility] EMERGENCY DEPARTMENT Provider Note   CSN: [id number] Arrival date & time: 11/24/19  1758    History Chief Complaint  Patient presents with  . Fever  . Emesis   Caroline Gaines is a 29 y.o. female with past medical history significant for polysubstance abuse, seizures who presents for evaluation of fever.  Patient states she was around her niece who had similar symptoms yesterday.  Patient complains of fever, T-max 102 this morning.  She has had some congestion, rhinorrhea, body aches and pains, emesis and cough.  Cough nonproductive.  2 episodes of NBNB emesis.  Denies chest pain, shortness of breath, hemoptysis, abdominal pain, diarrhea, dysuria.  Denies additional aggravating or alleviating factors.  Has taken Tylenol this morning with some relief in her fever.  Denies active polysubstance abuse.  History obtained from patient and past medical records.  No interpreter is used.  HPI     Past Medical History:  Diagnosis Date  . Anxiety   . Asthma   . Depression   . Endometriosis   . Gestational diabetes   . Polysubstance abuse ([place])   . Pregnancy induced hypertension   . Seizures ([place])   . SMOKER 11/02/2009   Qualifier: Diagnosis of  By: Melvyn Novas MD, Christena Deem     Patient Active Problem List   Diagnosis Date Noted  . Postpartum care and examination 10/31/2019  . Birth control counseling 10/31/2019  . Indication for care in labor and delivery, antepartum 09/17/2019  . Rubella non-immune status, antepartum 09/11/2019  . Trichomoniasis 09/04/2019  . Gestational diabetes 07/03/2019  . Loud snoring 06/04/2019  . Family history of alpha thalassemia 06/04/2019  . Alpha thalassemia silent carrier 06/04/2019  . Supervision of high risk pregnancy, antepartum 03/25/2019  . Major depressive disorder, recurrent, severe without psychotic features ([place])   . Major depressive disorder, recurrent episode, severe ([place]) 04/18/2015  . Generalized anxiety disorder  04/18/2015  . ASTHMA 11/02/2009  . HEADACHE, CHRONIC 11/02/2009    Past Surgical History:  Procedure Laterality Date  . DIAGNOSTIC LAPAROSCOPY  2012   In [place], endometriosis diagnosed and lesions removed     OB History    Gravida  1   Para  1   Term  1   Preterm  0   AB  0   Living  1     SAB  0   TAB  0   Ectopic  0   Multiple  0   Live Births  1           Family History  Problem Relation Age of Onset  . Asthma Father   . Hypertension Father   . Diabetes Mellitus II Father   . Migraines Mother     Social History   Tobacco Use  . Smoking status: Former Smoker    Packs/day: 1.50    Years: 9.00    Pack years: 13.50    Types: Cigarettes  . Smokeless tobacco: Never Used  . Tobacco comment: reports quitting 5 years ago  Substance Use Topics  . Alcohol use: No    Comment: social drinker only  . Drug use: Not Currently    Types: Cocaine, Marijuana, Oxycodone, Benzodiazepines, Heroin, Hydrocodone    Comment: pt reports last use is 5 years ago    Home Medications Prior to Admission medications   Medication Sig Start Date End Date Taking? Authorizing Provider  acetaminophen (TYLENOL) 500 MG tablet Take 1,000 mg by mouth every 6 (six) hours as  needed for headache. Reported on 11/16/2015    [provider]  benzocaine-Menthol (DERMOPLAST) 20-0.5 % AERO Apply 1 application topically as needed for irritation (perineal discomfort). 09/19/19   Fair, Marin Shutter, MD  dibucaine (NUPERCAINAL) 1 % OINT Place 1 application rectally as needed for hemorrhoids. 09/19/19   Fair, Marin Shutter, MD  ibuprofen (ADVIL) 600 MG tablet Take 1 tablet (600 mg total) by mouth every 6 (six) hours as needed for mild pain. 09/19/19   Fair, Marin Shutter, MD  loratadine (CLARITIN) 10 MG tablet Take 10 mg by mouth daily.    [provider]  norethindrone (MICRONOR) 0.35 MG tablet Take 1 tablet (0.35 mg total) by mouth daily. 09/19/19   Fair, Marin Shutter, MD  polyethylene  glycol (MIRALAX / GLYCOLAX) 17 g packet Take 17 g by mouth daily. 09/19/19   FairMarin Shutter, MD  prenatal vitamin w/FE, FA (PRENATAL 1 + 1) 27-1 MG TABS tablet Take 1 tablet by mouth daily at 12 noon. 03/18/19   Chancy Milroy, MD  sennosides-docusate sodium (SENOKOT-S) 8.6-50 MG tablet Take 1 tablet by mouth daily. 08/07/19   Donnamae Jude, MD  witch hazel-glycerin (TUCKS) pad Apply 1 application topically as needed for hemorrhoids. 09/19/19   FairMarin Shutter, MD    Allergies    Patient has no known allergies.  Review of Systems   Review of Systems  Constitutional: Positive for activity change, appetite change, chills, fatigue and fever.  HENT: Positive for congestion, postnasal drip and rhinorrhea. Negative for drooling, ear discharge, ear pain, facial swelling, sinus pressure, sinus pain, sore throat, trouble swallowing and voice change.   Respiratory: Positive for cough. Negative for apnea, choking, chest tightness, shortness of breath, wheezing and stridor.   Cardiovascular: Negative.   Gastrointestinal: Positive for nausea and vomiting. Negative for abdominal distention, abdominal pain, anal bleeding, blood in stool, constipation, diarrhea and rectal pain.  Genitourinary: Negative.   Musculoskeletal: Negative.   Skin: Negative.   Neurological: Negative.   All other systems reviewed and are negative.   Physical Exam Updated Vital Signs BP (!) 107/59   Pulse 88   Temp (!) 100.4 F (38 C) (Oral)   Resp 16   Ht 5' 5" (1.651 m)   Wt 77.1 kg   LMP 11/24/2019   SpO2 93%   BMI 28.29 kg/m   Physical Exam Vitals and nursing note reviewed.  Constitutional:      General: She is not in acute distress.    Appearance: She is not ill-appearing, toxic-appearing or diaphoretic.  HENT:     Head: Normocephalic and atraumatic.     Jaw: There is normal jaw occlusion.     Right Ear: Tympanic membrane, ear canal and external ear normal. There is no impacted cerumen. No hemotympanum.  Tympanic membrane is not injected, scarred, perforated, erythematous, retracted or bulging.     Left Ear: Tympanic membrane, ear canal and external ear normal. There is no impacted cerumen. No hemotympanum. Tympanic membrane is not injected, scarred, perforated, erythematous, retracted or bulging.     Ears:     Comments: No Mastoid tenderness.    Nose:     Comments: Clear rhinorrhea and congestion to bilateral nares.  No sinus tenderness.    Mouth/Throat:     Comments: Posterior oropharynx clear.  Mucous membranes moist.  Tonsils without erythema or exudate.  Uvula midline without deviation.  No evidence of PTA or RPA.  No drooling, dysphasia or trismus.  Phonation normal. Neck:  Trachea: Trachea and phonation normal.     Meningeal: Brudzinski's sign and Kernig's sign absent.     Comments: No Neck stiffness or neck rigidity.  No meningismus.  No cervical lymphadenopathy. Cardiovascular:     Comments: No murmurs rubs or gallops. Pulmonary:     Comments: Clear to auscultation bilaterally without wheeze, rhonchi or rales.  No accessory muscle usage.  Able speak in full sentences. Abdominal:     Comments: Soft, nontender without rebound or guarding.  No CVA tenderness.  Musculoskeletal:     Comments: Moves all 4 extremities without difficulty.  Lower extremities without edema, erythema or warmth.  Skin:    Comments: Brisk capillary refill.  No rashes or lesions.  Neurological:     Mental Status: She is alert.     Comments: Ambulatory in department without difficulty.  Cranial nerves II through XII grossly intact.  No facial droop.  No aphasia.     ED Results / Procedures / Treatments   Labs (all labs ordered are listed, but only abnormal results are displayed) Labs Reviewed  COMPREHENSIVE METABOLIC PANEL - Abnormal; Notable for the following components:      Result Value   Potassium 3.0 (*)    CO2 21 (*)    Glucose, Bld 164 (*)    Calcium 8.4 (*)    All other components within  normal limits  CBC - Abnormal; Notable for the following components:   Hemoglobin 11.9 (*)    MCH 25.5 (*)    All other components within normal limits  URINALYSIS, ROUTINE W REFLEX MICROSCOPIC - Abnormal; Notable for the following components:   Hgb urine dipstick SMALL (*)    All other components within normal limits  URINALYSIS, MICROSCOPIC (REFLEX) - Abnormal; Notable for the following components:   Bacteria, UA RARE (*)    All other components within normal limits  CULTURE, BLOOD (ROUTINE X 2)  CULTURE, BLOOD (ROUTINE X 2)  SARS CORONAVIRUS 2 (TAT 6-24 HRS)  LIPASE, BLOOD  I-STAT BETA HCG BLOOD, ED ([facility], [facility], AP ONLY)  POC SARS CORONAVIRUS 2 AG -  ED    EKG None  Radiology DG Chest Portable 1 View  Result Date: 11/24/2019 CLINICAL DATA:  Cough and fever EXAM: PORTABLE CHEST 1 VIEW COMPARISON:  None. FINDINGS: The heart size and mediastinal contours are within normal limits. Both lungs are clear. The visualized skeletal structures are unremarkable. IMPRESSION: No active disease. Electronically Signed   By: Ulyses Jarred M.D.   On: 11/24/2019 19:25    Procedures Procedures (including critical care time)  Medications Ordered in ED Medications  acetaminophen (TYLENOL) tablet 650 mg (650 mg Oral Given 11/24/19 1918)  ondansetron (ZOFRAN-ODT) disintegrating tablet 4 mg (4 mg Oral Given 11/24/19 1918)    ED Course  I have reviewed the triage vital signs and the nursing notes.  Pertinent labs & imaging results that were available during my care of the patient were reviewed by me and considered in my medical decision making (see chart for details).  29 year old female appears otherwise well presents for evaluation of fever and upper respiratory symptoms.  She has a low-grade temperature 100.4 here in the emergency department.  Patient's niece with similar symptoms.  Patient's heart and lungs are clear.  Abdomen soft, nontender.  No urinary symptoms or concerns for STDs.  She has had  some intermittent tachycardia to the low 100s however denies chest pain, shortness of breath or hemoptysis.  No evidence of DVT on exam.  Tolerating p.o.  intake at home without difficulty.  Labs obtained from triage personally reviewed interpreted.  She does have a history of polysubstance abuse will obtain blood cultures however no new murmur, no skin manifestations of endocarditis, she denies any chest pain.  Clinical Course as of Nov 24 2023  Sun Nov 24, 2019  1910 No leukocytosis, hemoglobin 11.9   CBC(!) [BH]  1910 Mild hypokalemia to 3.0, mild hyperglycemia to 164, no additional electrolyte, renal or liver abnormality will replace potassium with oral supplementation  Comprehensive metabolic panel(!) [BH]  5916 27  Lipase, blood [BH]  1910 Negative  I-Stat beta hCG blood, ED [BH]  1910 Rapid Covid negative  POC SARS Coronavirus 2 Ag-ED - Nasal Swab (BD Veritor Kit) [BH]    Clinical Course User Index [BH] ,  A, PA-C    Patient defervesced to 98.6.  Tachycardia improved when she defervesced.  She is tolerating p.o. intake without difficulty.  Continue benign abdominal exam.  Patient ambulatory without any hypoxia.  Likely viral infection.  Did get some blood cultures given her history of polysubstance abuse.  Discussed symptomatic management at home.  She is to return for any new worsening symptoms.  The patient has been appropriately medically screened and/or stabilized in the ED. I have low suspicion for any other emergent medical condition which would require further screening, evaluation or treatment in the ED or require inpatient management.  Patient is hemodynamically stable and in no acute distress.  Patient able to ambulate in department prior to ED.  Evaluation does not show acute pathology that would require ongoing or additional emergent interventions while in the emergency department or further inpatient treatment.  I have discussed the diagnosis with the patient  and answered all questions.  Pain is been managed while in the emergency department and patient has no further complaints prior to discharge.  Patient is comfortable with plan discussed in room and is stable for discharge at this time.  I have discussed strict return precautions for returning to the emergency department.  Patient was encouraged to follow-up with PCP/specialist refer to at discharge. MDM Rules/Calculators/A&P                     Lareen Mullings was evaluated in Emergency Department on 11/24/2019 for the symptoms described in the history of present illness. She was evaluated in the context of the global COVID-19 pandemic, which necessitated consideration that the patient might be at risk for infection with the SARS-CoV-2 virus that causes COVID-19. Institutional protocols and algorithms that pertain to the evaluation of patients at risk for COVID-19 are in a state of rapid change based on information released by regulatory bodies including the CDC and federal and state organizations. These policies and algorithms were followed during the patient's care in the ED. Marland KitchenFinal Clinical Impression(s) / ED Diagnoses Final diagnoses:  Viral upper respiratory tract infection    Rx / DC Orders ED Discharge Orders    None       ,  A, PA-C 11/24/19 2025    Fredia Sorrow, MD 11/26/19 1538

## 2019-11-24 NOTE — Discharge Instructions (Signed)
Tylenol for body aches. Zofran for nausea or emesis.  Return to the ED for any new worsening symptoms

## 2019-11-25 LAB — SARS CORONAVIRUS 2 (TAT 6-24 HRS): SARS Coronavirus 2: NEGATIVE

## 2019-12-03 LAB — CULTURE, BLOOD (ROUTINE X 2)
Culture: NO GROWTH
Culture: NO GROWTH

## 2019-12-05 ENCOUNTER — Ambulatory Visit: Payer: Medicaid Other | Admitting: Obstetrics and Gynecology

## 2019-12-06 NOTE — Progress Notes (Signed)
The patient did not keep her appointment.   Dulcy Sida I, NP 12/06/2019 3:38 PM  

## 2020-01-07 ENCOUNTER — Encounter: Payer: Self-pay | Admitting: *Deleted

## 2020-01-13 ENCOUNTER — Ambulatory Visit: Payer: Medicaid Other | Attending: Internal Medicine

## 2020-01-13 DIAGNOSIS — Z20822 Contact with and (suspected) exposure to covid-19: Secondary | ICD-10-CM

## 2020-01-14 LAB — SARS-COV-2, NAA 2 DAY TAT

## 2020-01-14 LAB — NOVEL CORONAVIRUS, NAA: SARS-CoV-2, NAA: NOT DETECTED

## 2020-03-04 ENCOUNTER — Other Ambulatory Visit: Payer: Self-pay

## 2020-03-04 DIAGNOSIS — F53 Postpartum depression: Secondary | ICD-10-CM

## 2020-03-04 NOTE — Progress Notes (Signed)
Put in referral to see jamie or andrea

## 2020-03-19 NOTE — BH Specialist Note (Signed)
Pt did not arrive to video visit and did not answer the phone ; Left HIPPA-compliant message to call back Asher Muir from [facility] [employer] [facility] at [phone] (main office) or [phone] (Annmarie Plemmons's office).  ; left MyChart message for patient.   Note that pt has been seen by her PCP for [facility] issue postpartum, and may not need visit with [facility].    [facility] via Telemedicine Video (Caregility) Visit  03/19/2020 Caroline Gaines [phone]   [name]

## 2020-03-26 ENCOUNTER — Other Ambulatory Visit: Payer: Self-pay

## 2020-03-26 ENCOUNTER — Ambulatory Visit: Payer: Medicaid Other | Admitting: Clinical

## 2020-03-26 DIAGNOSIS — Z91199 Patient's noncompliance with other medical treatment and regimen due to unspecified reason: Secondary | ICD-10-CM

## 2020-04-03 IMAGING — US US MFM OB FOLLOW-UP
1 series · 13 of 28 positions shown · non-contrast
Comparison: none

[Series 1: us mfm ob follow-up · 59 acquisitions, 13 frames shown]
[im 3/59]
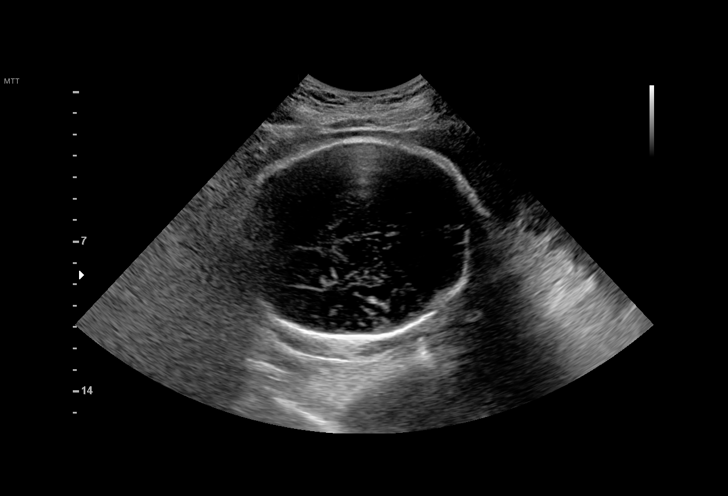
[im 7/59]
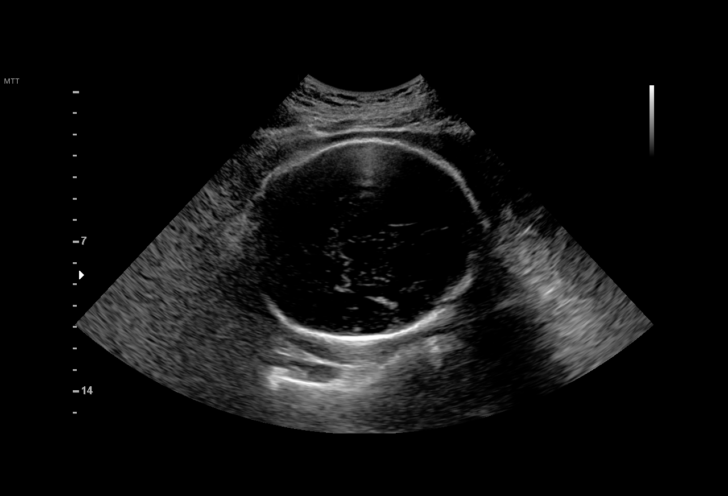
[im 11/59]
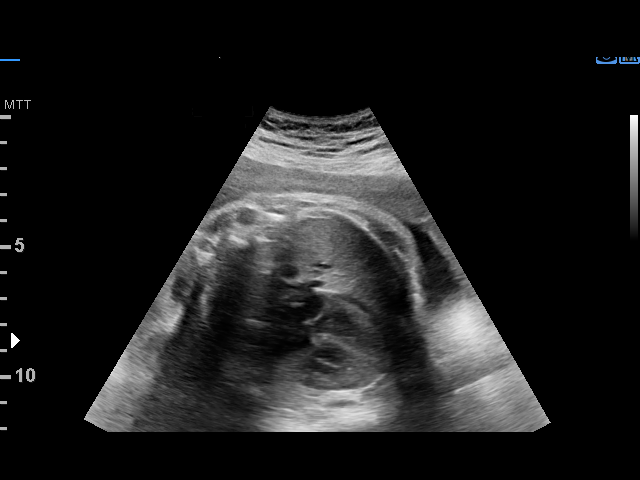
[im 16/59]
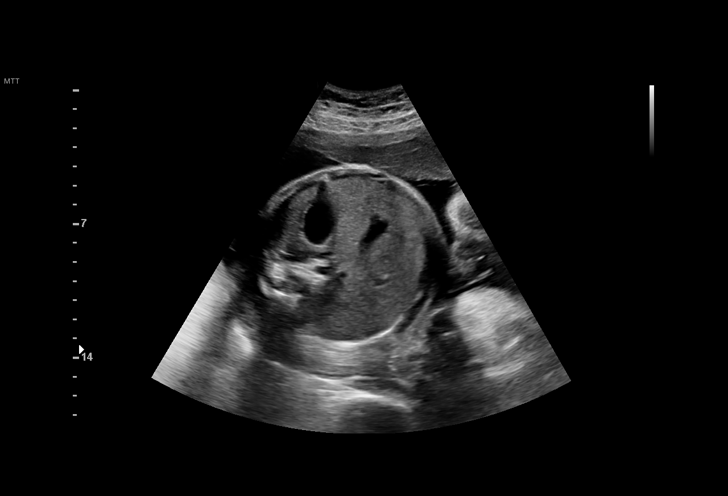
[im 20/59]
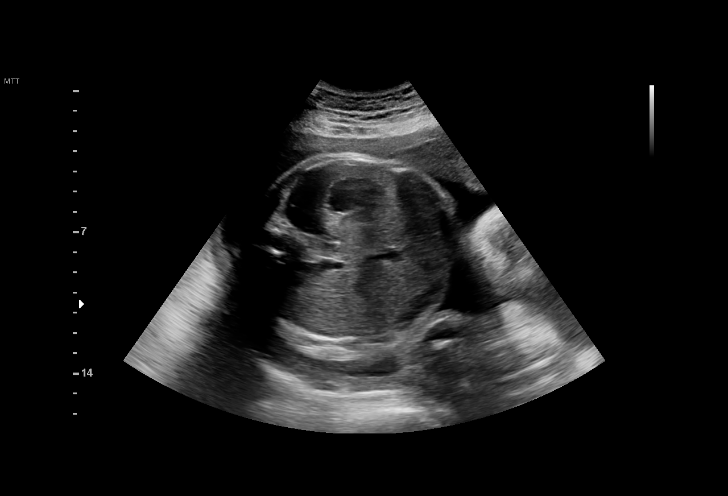
[im 24/59]
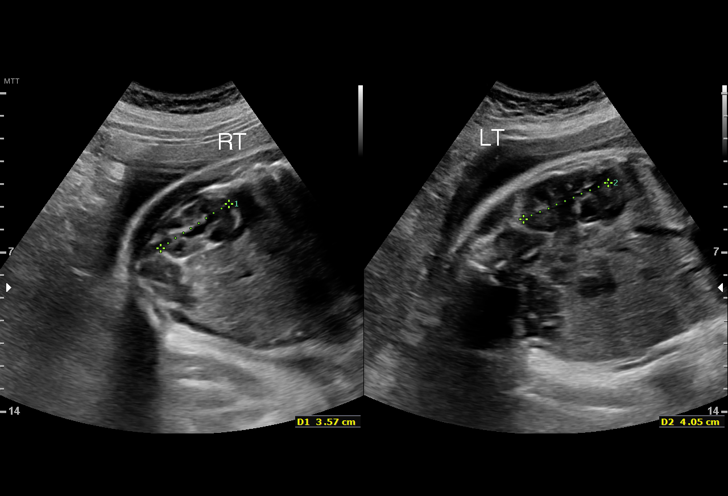
[im 31/59]
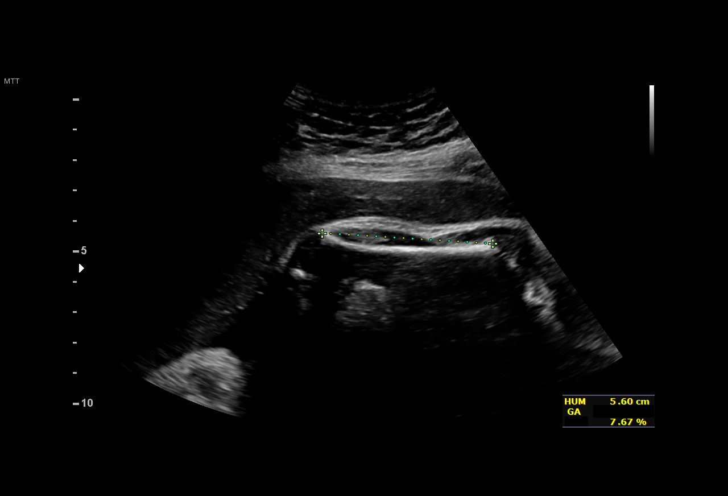
[im 35/59]
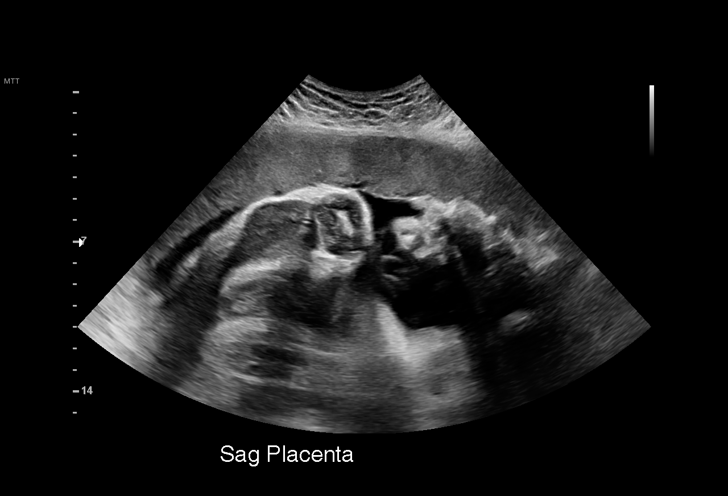
[im 39/59]
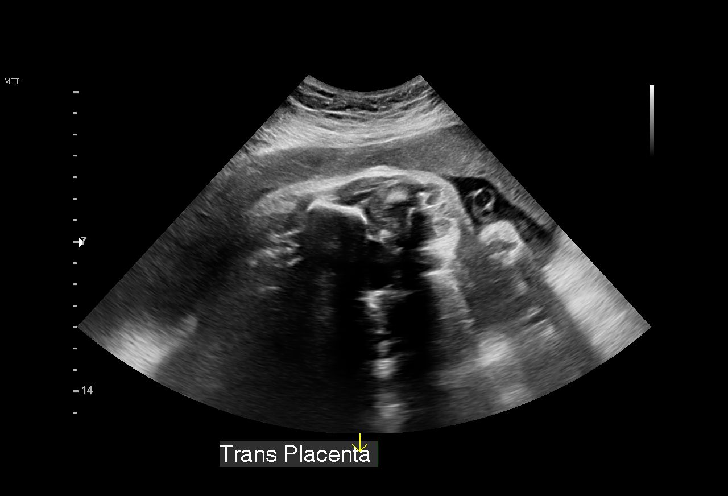
[im 43/59]
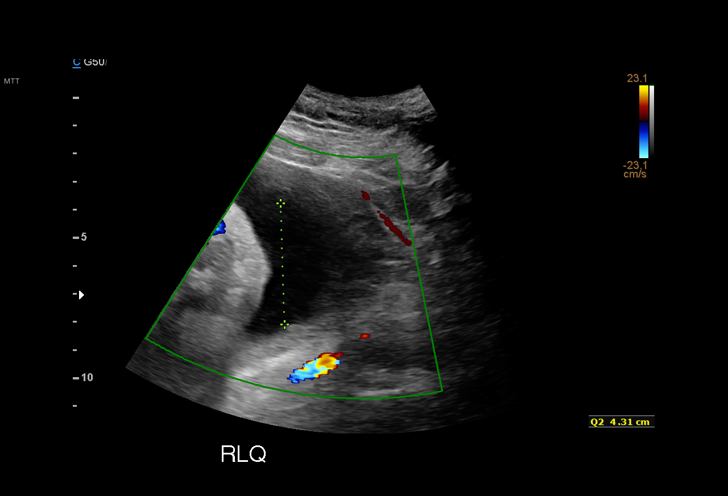
[im 48/59]
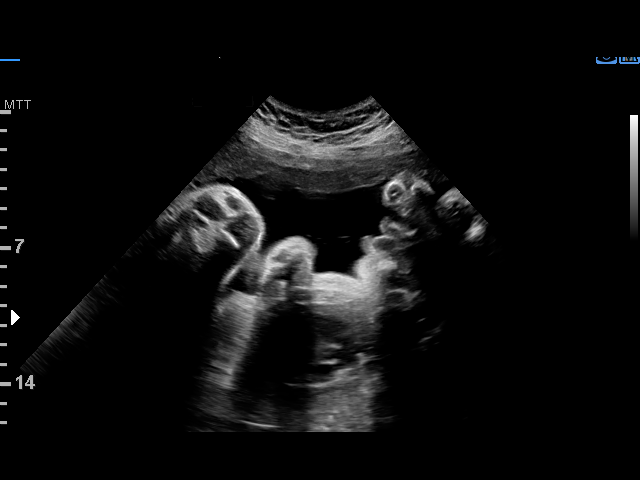
[im 52/59]
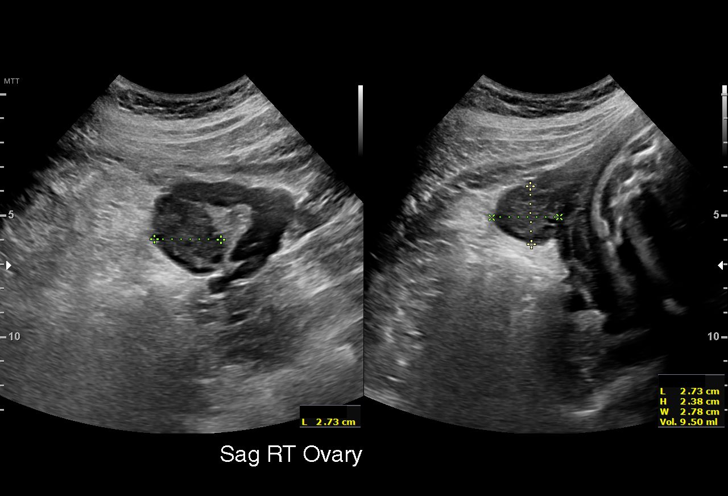
[im 56/59]
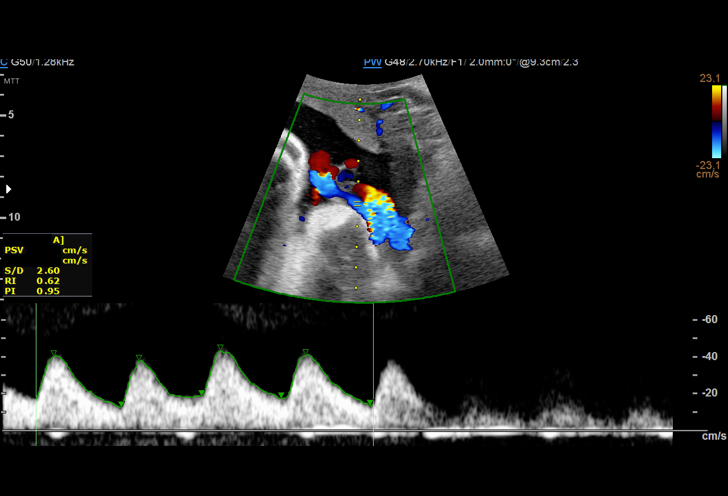

[13 of 28 positions shown; findings below may reference images not displayed]

[address]

 ----------------------------------------------------------------------

 ----------------------------------------------------------------------
Indications

  37 weeks gestation of pregnancy
  Gestational diabetes in pregnancy,
  controlled by oral hypoglycemic drugs
  Genetic carrier (silent Quirijn Amazigh)
  Encounter for other antenatal screening
  follow-up
 ----------------------------------------------------------------------
Vital Signs

                                                Height:        5'5"
Fetal Evaluation

 Num Of Fetuses:         1
 Fetal Heart Rate(bpm):  162
 Cardiac Activity:       Observed
 Presentation:           Cephalic
 Placenta:               Anterior
 P. Cord Insertion:      Previously Visualized

 Amniotic Fluid
 AFI FV:      Within normal limits

 AFI Sum(cm)     %Tile       Largest Pocket(cm)
 18.83           73

 RUQ(cm)       RLQ(cm)       LUQ(cm)        LLQ(cm)

Biophysical Evaluation

 Amniotic F.V:   Within normal limits       F. Tone:        Observed
 F. Movement:    Observed                   Score:          [DATE]
 F. Breathing:   Observed
Biometry

 BPD:      91.9  mm     G. Age:  37w 2d         58  %    CI:        78.84   %    70 - 86
                                                         FL/HC:      20.7   %    20.9 -
 HC:      327.3  mm     G. Age:  37w 1d         15  %    HC/AC:      0.98        0.92 -
 AC:      332.3  mm     G. Age:  37w 1d         47  %    FL/BPD:     73.8   %    71 - 87
 FL:       67.8  mm     G. Age:  34w 6d          2  %    FL/AC:      20.4   %    20 - 24
 HUM:      56.7  mm     G. Age:  33w 0d        < 5  %
 LV:        4.1  mm

 Est. FW:    5226  gm      6 lb 9 oz     29  %
OB History

 Gravidity:    1         Term:   0        Prem:   0        SAB:   0
 TOP:          0       Ectopic:  0        Living: 0
Gestational Age

 LMP:           37w 6d        Date:  12/18/18                 EDD:   09/24/19
 U/S Today:     36w 4d                                        EDD:   10/03/19
 Best:          37w 6d     Det. By:  LMP  (12/18/18)          EDD:   09/24/19
Anatomy

 Cranium:               Previously seen        LVOT:                   Previously seen
 Cavum:                 Previously seen        Aortic Arch:            Previously seen
 Ventricles:            Appears normal         Ductal Arch:            Previously seen
 Choroid Plexus:        Previously seen        Diaphragm:              Appears normal
 Cerebellum:            Previously seen        Stomach:                Appears normal, left
                                                                       sided
 Posterior Fossa:       Previously seen        Abdomen:                Previously seen
 Nuchal Fold:           Previously seen        Abdominal Wall:         Appears nml (cord
                                                                       insert, abd wall)
 Face:                  Orbits and profile     Cord Vessels:           Previously seen
                        previously seen
 Lips:                  Previously seen        Kidneys:                Appear normal
 Palate:                Previously seen        Bladder:                Appears normal
 Thoracic:              Previously seen        Spine:                  Previously seen
 Heart:                 Previously seen        Upper Extremities:      Previously seen
 RVOT:                  Previously seen        Lower Extremities:      Previously seen

 Other:  Heels and 5th digit previously visualized. Nasal bone previously seen.
         Technically difficult due to advanced gestational age.
Doppler - Fetal Vessels

 Umbilical Artery
  S/D     %tile     RI                                     ADFV    RDFV
 2.85       81   0.65                                         No      No

Cervix Uterus Adnexa

 Cervix
 Not visualized (advanced GA >67wks)

 Uterus
 No abnormality visualized.
 Left Ovary
 Within normal limits. No adnexal mass visualized.

 Right Ovary
 Within normal limits. No adnexal mass visualized.

 Cul De Sac
 No free fluid seen.

 Adnexa
 No abnormality visualized.
Impression

 Gestational diabetes. Patient takes metformin for control.
 Amniotic fluid is normal and good fetal activity is seen. Fetal
 growth is appropriate for gestational age. Antenatal testing is
 reassuring. BPP [DATE].
Recommendations

 -Patient has an appointment for BPP at your office on
 09/11/19 (not charged for BPP today).
                 July, Leroy

## 2020-05-27 ENCOUNTER — Other Ambulatory Visit: Payer: Self-pay

## 2020-05-27 ENCOUNTER — Other Ambulatory Visit: Payer: Medicaid Other

## 2020-05-27 DIAGNOSIS — Z20822 Contact with and (suspected) exposure to covid-19: Secondary | ICD-10-CM

## 2020-05-28 LAB — SARS-COV-2, NAA 2 DAY TAT

## 2020-05-28 LAB — NOVEL CORONAVIRUS, NAA: SARS-CoV-2, NAA: NOT DETECTED

## 2020-06-12 ENCOUNTER — Ambulatory Visit (INDEPENDENT_AMBULATORY_CARE_PROVIDER_SITE_OTHER): Payer: Medicaid Other | Admitting: *Deleted

## 2020-06-12 ENCOUNTER — Other Ambulatory Visit (HOSPITAL_COMMUNITY)
Admission: RE | Admit: 2020-06-12 | Discharge: 2020-06-12 | Disposition: A | Discharge status: Home / Self Care | Payer: Medicaid Other | Source: Ambulatory Visit | Attending: Family Medicine | Admitting: Family Medicine

## 2020-06-12 ENCOUNTER — Other Ambulatory Visit: Payer: Self-pay

## 2020-06-12 VITALS — BP 119/73 | HR 79 | Ht 65.0 in | Wt 169.0 lb

## 2020-06-12 DIAGNOSIS — N898 Other specified noninflammatory disorders of vagina: Secondary | ICD-10-CM | POA: Diagnosis present

## 2020-06-12 DIAGNOSIS — Z8619 Personal history of other infectious and parasitic diseases: Secondary | ICD-10-CM | POA: Diagnosis present

## 2020-06-12 MED ORDER — FLUCONAZOLE 150 MG PO TABS: 150.0000 mg | ORAL_TABLET | Freq: Once | ORAL | 0 refills | Status: AC

## 2020-06-12 MED ORDER — METRONIDAZOLE 500 MG PO TABS: 500.0000 mg | ORAL_TABLET | Freq: Two times a day (BID) | ORAL | 0 refills | Status: DC

## 2020-06-12 NOTE — Progress Notes (Signed)
Pt states she had +Trich in July - was tested @ PCP office - Dr. Romie Jumper. She was not able to complete the full course of treatment and is now having vaginal discharge, vaginal itching and irritation. Self swab was obtained and will be sent for STI testing and wet prep. Pt states she is going out of town in 2 days and requests Rx for her sx. Metronidazole and Diflucan e-prescribed per consult w/Julie Magnus Sinning, PA. Pt advised she will be notified of test results via MyChart. She agreed and voiced understanding.

## 2020-06-14 NOTE — Progress Notes (Signed)
Chart reviewed for nurse visit. Agree with plan of care.   Marny Lowenstein, PA-C 06/14/2020 1:50 PM

## 2020-06-15 LAB — CERVICOVAGINAL ANCILLARY ONLY
Bacterial Vaginitis (gardnerella): POSITIVE — AB
Candida Glabrata: NEGATIVE
Candida Vaginitis: NEGATIVE
Chlamydia: NEGATIVE
Comment: NEGATIVE
Comment: NEGATIVE
Comment: NEGATIVE
Comment: NEGATIVE
Comment: NEGATIVE
Comment: NORMAL
Neisseria Gonorrhea: NEGATIVE
Trichomonas: POSITIVE — AB

## 2020-08-04 ENCOUNTER — Other Ambulatory Visit: Payer: Self-pay

## 2020-08-04 ENCOUNTER — Ambulatory Visit (INDEPENDENT_AMBULATORY_CARE_PROVIDER_SITE_OTHER): Payer: Medicaid Other

## 2020-08-04 ENCOUNTER — Other Ambulatory Visit (HOSPITAL_COMMUNITY)
Admission: RE | Admit: 2020-08-04 | Discharge: 2020-08-04 | Disposition: A | Discharge status: Home / Self Care | Payer: Medicaid Other | Source: Ambulatory Visit | Attending: Family Medicine | Admitting: Family Medicine

## 2020-08-04 VITALS — BP 106/56 | HR 74 | Wt 167.4 lb

## 2020-08-04 DIAGNOSIS — Z202 Contact with and (suspected) exposure to infections with a predominantly sexual mode of transmission: Secondary | ICD-10-CM | POA: Diagnosis present

## 2020-08-04 NOTE — Progress Notes (Addendum)
Pt reports that she is having irritation and vaginal discharge that she has had in the past when she tested positive for Trich.  Pt requested to do STD screening and to obtain HIV, Hep B/C, and RPR blood work today.  Pt explained how to obtain self swab and that we will call her with abnormal results in 24-48 hrs.  Pt verbalized understanding with no further questions.   Addison Naegeli, RN  08/04/20  Chart reviewed for nurse visit. Agree with plan of care.   Currie Paris, NP 08/04/2020 5:36 PM

## 2020-08-05 LAB — CERVICOVAGINAL ANCILLARY ONLY
Bacterial Vaginitis (gardnerella): POSITIVE — AB
Candida Glabrata: NEGATIVE
Candida Vaginitis: NEGATIVE
Chlamydia: NEGATIVE
Comment: NEGATIVE
Comment: NEGATIVE
Comment: NEGATIVE
Comment: NEGATIVE
Comment: NEGATIVE
Comment: NORMAL
Neisseria Gonorrhea: NEGATIVE
Trichomonas: POSITIVE — AB

## 2020-08-05 LAB — HIV ANTIBODY (ROUTINE TESTING W REFLEX): HIV Screen 4th Generation wRfx: NONREACTIVE

## 2020-08-05 LAB — HEPATITIS B SURFACE ANTIGEN: Hepatitis B Surface Ag: NEGATIVE

## 2020-08-05 LAB — HEPATITIS C ANTIBODY: Hep C Virus Ab: 0.1 s/co ratio (ref 0.0–0.9)

## 2020-08-05 LAB — RPR: RPR Ser Ql: NONREACTIVE

## 2020-08-06 ENCOUNTER — Telehealth: Payer: Self-pay | Admitting: Family Medicine

## 2020-08-06 MED ORDER — METRONIDAZOLE 500 MG PO TABS: 500.0000 mg | ORAL_TABLET | Freq: Two times a day (BID) | ORAL | 0 refills | Status: DC

## 2020-08-06 NOTE — Telephone Encounter (Signed)
Patient want to discuss her test results

## 2020-08-06 NOTE — Telephone Encounter (Signed)
Returned patients call. Informed her she has Trichomonas and BV, all other labs normal.   Discussed that Caroline Gaines is a sexually transmitted disease and partner need to go to [facility],  Urgent Care or PCP for treatment.   Flagyl ordered per standing protocol. Advised no alcohol while taking Flagyl and to abstain from sexual intercourse for 2 weeks after both partners have been treated.   Patient voiced understanding.

## 2020-09-01 ENCOUNTER — Telehealth: Payer: Self-pay | Admitting: *Deleted

## 2020-09-01 NOTE — Telephone Encounter (Signed)
Sent My Chart message with list of local PCP's

## 2020-09-01 NOTE — Telephone Encounter (Signed)
Pt left VM message yesterday stating that she is having stomach pain and digestive issues. She requests referral to GI doctor or PCP.

## 2021-10-03 DIAGNOSIS — R7303 Prediabetes: Secondary | ICD-10-CM

## 2021-10-03 HISTORY — DX: Prediabetes: R73.03

## 2022-05-25 ENCOUNTER — Telehealth: Payer: Self-pay | Admitting: Family Medicine

## 2022-05-25 NOTE — Telephone Encounter (Signed)
Patient called to schedule an appointment for an Annual,  She was told we will be scheduling in October,that wasn't good for her, I told her she was welcome to call around to see who could get her in before October.

## 2022-07-21 ENCOUNTER — Ambulatory Visit (INDEPENDENT_AMBULATORY_CARE_PROVIDER_SITE_OTHER): Payer: Medicaid Other | Admitting: Obstetrics & Gynecology

## 2022-07-21 ENCOUNTER — Other Ambulatory Visit: Payer: Self-pay

## 2022-07-21 ENCOUNTER — Encounter: Payer: Self-pay | Admitting: Obstetrics & Gynecology

## 2022-07-21 VITALS — BP 130/73 | HR 84

## 2022-07-21 DIAGNOSIS — N926 Irregular menstruation, unspecified: Secondary | ICD-10-CM | POA: Diagnosis not present

## 2022-07-21 DIAGNOSIS — E349 Endocrine disorder, unspecified: Secondary | ICD-10-CM | POA: Diagnosis not present

## 2022-07-21 MED ORDER — DROSPIRENONE-ETHINYL ESTRADIOL 3-0.03 MG PO TABS: 1.0000 | ORAL_TABLET | Freq: Every day | ORAL | 11 refills | Status: DC

## 2022-07-21 NOTE — Progress Notes (Signed)
GYNECOLOGY OFFICE VISIT NOTE  History:   Caroline Gaines is a 31 y.o. G2P1011 here today for evaluation of different symptoms. Reports water retention, increased acne around her menstrual periods.  Periods are also irregular and can last up to eight days.  History of endometriosis.  Also endorses hair loss on her head and increased hair growth on other parts of her body.  Reports having negative evaluation for thyroid disorders by her PCP. She denies any abnormal vaginal discharge,  pelvic pain or other concerns.    Past Medical History:  Diagnosis Date   Alpha thalassemia silent carrier 06/04/2019   SILENT Carrier Alpha-Thal, genetic consult 05/07/2019 FOB: Lupe Carney 10-10-1988, Alpha thal   Anxiety    Asthma    Depression    Endometriosis    Gestational diabetes    HEADACHE, CHRONIC 11/02/2009   Qualifier: Diagnosis of  By: Tilden Dome     Loud snoring 06/04/2019   Major depressive disorder, recurrent, severe without psychotic features ([place])    Polysubstance abuse ([place])    Pregnancy induced hypertension    Seizures ([place])    SMOKER 11/02/2009   Qualifier: Diagnosis of  By: Melvyn Novas MD, Christena Deem    Trichomoniasis 09/04/2019   toc neg    Past Surgical History:  Procedure Laterality Date   DIAGNOSTIC LAPAROSCOPY  2012   In [place], endometriosis diagnosed and lesions removed    The following portions of the patient's history were reviewed and updated as appropriate: allergies, current medications, past family history, past medical history, past social history, past surgical history and problem list.   Health Maintenance:  Normal pap in 2020.    Review of Systems:  Pertinent items noted in HPI and remainder of comprehensive ROS otherwise negative.  Physical Exam:  BP 130/73   Pulse 84  CONSTITUTIONAL: Well-developed, well-nourished female in no acute distress.  HEENT:  Normocephalic, atraumatic. External right and left ear normal. No scleral icterus.  NECK: Normal  range of motion, supple, no masses noted on observation SKIN: No rash noted. Not diaphoretic. No erythema. No pallor. MUSCULOSKELETAL: Normal range of motion. No edema noted. NEUROLOGIC: Alert and oriented to person, place, and time. Normal muscle tone coordination. No cranial nerve deficit noted. PSYCHIATRIC: Normal mood and affect. Normal behavior. Normal judgment and thought content. CARDIOVASCULAR: Normal heart rate noted RESPIRATORY: Effort and breath sounds normal, no problems with respiration noted ABDOMEN: No masses noted. No other overt distention noted.   PELVIC: Deferred     Assessment and Plan:     1. Hormonal disorder 2. Irregular menstrual bleeding Myriad symptoms reported. Will evaluate for possible PCOS or other hormonal disorders. Normal thyroid studies noted on review of results in [place].  Given her symptoms, she will benefit from a trial of OCPs. This was discussed with patient, risks and benefits and side effects reviewed. Yasmin prescribed, will do BP check and follow up in 2 months.   Will follow up results and manage accordingly. - DHEA-sulfate - Estradiol - Follicle stimulating hormone - Testosterone,Free and Total - Prolactin - Beta hCG quant (ref lab) - drospirenone-ethinyl estradiol (YASMIN 28) 3-0.03 MG tablet; Take 1 tablet by mouth daily.  Dispense: 28 tablet; Refill: 11   Routine preventative health maintenance measures emphasized.  Patient was offered pap smear today, but she declined as her son was with her and she will schedule this at another visit. Please refer to After Visit Summary for other counseling recommendations.   Return in 2 months (on 09/20/2022) for  Followup and annual pap smear.    I spent 30 minutes dedicated to the care of this patient including pre-visit review of records, face to face time with the patient discussing her conditions and treatments and post visit orders.    Verita Schneiders, MD, [facility] [employer] [facility], [place]

## 2022-07-26 LAB — TESTOSTERONE,FREE AND TOTAL
Testosterone, Free: 0.9 pg/mL (ref 0.0–4.2)
Testosterone: 33 ng/dL (ref 13–71)

## 2022-07-26 LAB — PROLACTIN: Prolactin: 14.9 ng/mL (ref 4.8–23.3)

## 2022-07-26 LAB — BETA HCG QUANT (REF LAB): hCG Quant: <1 m[IU]/mL

## 2022-07-26 LAB — ESTRADIOL: Estradiol: 141 pg/mL

## 2022-07-26 LAB — DHEA-SULFATE: DHEA-SO4: 197 ug/dL (ref 84.8–378.0)

## 2022-07-26 LAB — FOLLICLE STIMULATING HORMONE: FSH: 2.4 m[IU]/mL

## 2022-08-05 ENCOUNTER — Telehealth: Payer: Self-pay | Admitting: *Deleted

## 2022-08-05 NOTE — Telephone Encounter (Signed)
I called [facility] to ask if patient's insurance will cover name brand for her ocp and they informed me it will not cover name brand, only generic. I called Necia and informed her of this. I asked what issues the pill is causing. She reports she is super moody, having random cramps, no period yet, and severe migraines, and not helping with skin breakouts which was one of main reasons she wanted to try it. I clarified she is on 1st pack , 3rd week of her pills. I discussed with her that her body is still adjusting and things may get better but if these symptoms are too difficult I can send message to her provider to see what she reccommends. She states she has looked on good Rx and she wants to know if it would be best to pay out of pocket for this pill or switch. I explained her provider is not here today and I will send this message to her provider and either she will send a MyChart message , call her or have [place] call her and since this is Friday that it may be early next week before she hears back. She voices understanding. Staci Acosta

## 2022-08-05 NOTE — Telephone Encounter (Signed)
Received a voicemessage from [place] this am stating she is calling re: her birth control medication that was given at her last visit States is seems they have sent in off brand for Yaz and it is just not working so well. Wants a call to discuss advice or to change the medicine. Staci Acosta

## 2022-08-09 NOTE — Telephone Encounter (Signed)
I called Caroline Gaines to inform her of Dr. Harolyn Rutherford recommendation she can stay on current med if she chooses to pay out of pocket with [facility] or we can send in new RX for Sprintec which may be cheaper. Also that with either pill to be patient as it will take time for the pills to be effective. [name]  states she will do some research and will let us know if she want [place] to send in new RX. Staci Acosta

## 2022-09-08 ENCOUNTER — Other Ambulatory Visit (HOSPITAL_COMMUNITY)
Admission: RE | Admit: 2022-09-08 | Discharge: 2022-09-08 | Disposition: A | Discharge status: Home / Self Care | Payer: Medicaid Other | Source: Ambulatory Visit | Attending: Family Medicine | Admitting: Family Medicine

## 2022-09-08 ENCOUNTER — Ambulatory Visit (INDEPENDENT_AMBULATORY_CARE_PROVIDER_SITE_OTHER): Payer: Medicaid Other

## 2022-09-08 VITALS — BP 126/85 | HR 66 | Wt 173.1 lb

## 2022-09-08 DIAGNOSIS — N898 Other specified noninflammatory disorders of vagina: Secondary | ICD-10-CM | POA: Diagnosis present

## 2022-09-08 MED ORDER — NORGESTIMATE-ETH ESTRADIOL 0.25-35 MG-MCG PO TABS: 1.0000 | ORAL_TABLET | Freq: Every day | ORAL | 11 refills | Status: DC

## 2022-09-08 NOTE — Progress Notes (Signed)
Pt reports white discharge last week but none this week.  Pt informs me that she has a new partner and would be interested in STD testing.  Pt explained how to obtain self swab and that we will call with abnormal results.   Pt also reports that she wanted to start the BCP's that would help with acne.  Per chart review, pt was going to be prescribed Sprintec by Dr. Macon Large.  Sprintec e-prescribed per chart review as she reports LMP 09/05/22 currently bleeding today.     Pt verbalized understanding with no further questions.  Addison Naegeli, RN  09/08/22

## 2022-09-09 LAB — CERVICOVAGINAL ANCILLARY ONLY
Bacterial Vaginitis (gardnerella): POSITIVE — AB
Candida Glabrata: NEGATIVE
Candida Vaginitis: NEGATIVE
Chlamydia: NEGATIVE
Comment: NEGATIVE
Comment: NEGATIVE
Comment: NEGATIVE
Comment: NEGATIVE
Comment: NEGATIVE
Comment: NORMAL
Neisseria Gonorrhea: NEGATIVE
Trichomonas: NEGATIVE

## 2022-09-09 MED ORDER — METRONIDAZOLE 500 MG PO TABS: 500.0000 mg | ORAL_TABLET | Freq: Two times a day (BID) | ORAL | 0 refills | Status: AC

## 2022-09-09 NOTE — Addendum Note (Signed)
Addended by: Reva Bores on: 09/09/2022 07:46 PM   Modules accepted: Orders

## 2022-09-20 ENCOUNTER — Encounter: Payer: Self-pay | Admitting: Medical

## 2022-09-20 ENCOUNTER — Ambulatory Visit (INDEPENDENT_AMBULATORY_CARE_PROVIDER_SITE_OTHER): Payer: Medicaid Other | Admitting: Medical

## 2022-09-20 ENCOUNTER — Other Ambulatory Visit (HOSPITAL_COMMUNITY)
Admission: RE | Admit: 2022-09-20 | Discharge: 2022-09-20 | Disposition: A | Discharge status: Home / Self Care | Payer: Medicaid Other | Source: Ambulatory Visit | Attending: Medical | Admitting: Medical

## 2022-09-20 VITALS — BP 119/85 | HR 72 | Ht 65.0 in | Wt 170.5 lb

## 2022-09-20 DIAGNOSIS — Z124 Encounter for screening for malignant neoplasm of cervix: Secondary | ICD-10-CM | POA: Diagnosis not present

## 2022-09-20 DIAGNOSIS — Z01419 Encounter for gynecological examination (general) (routine) without abnormal findings: Secondary | ICD-10-CM | POA: Diagnosis not present

## 2022-09-20 NOTE — Progress Notes (Signed)
History:  Ms. Caroline Gaines is a 31 y.o. G2P1011 who presents to clinic today for annual exam with pap smear. Had recent annual physical with PCP, but is due for pap test. Would like STD testing today, but declines blood work. She is currently sexually active, on OCPs and using condoms. LMP 09/05/22. Periods last ~ 7 days and are regular even prior to OCPs which started a few months ago. She was treated for BV recently and is still having a small amount of white discharge at times. She denies pain, bleeding or breast concerns today.   The following portions of the patient's history were reviewed and updated as appropriate: allergies, current medications, family history, past medical history, social history, past surgical history and problem list.  Review of Systems:  Review of Systems  Constitutional:  Negative for fever and malaise/fatigue.  Gastrointestinal:  Positive for nausea and vomiting. Negative for abdominal pain, constipation and diarrhea.  Genitourinary:  Negative for dysuria, frequency and urgency.       Neg - vaginal bleeding, pelvic pain + discharge     Objective:  Physical Exam BP 119/85   Pulse 72   Ht 5\' 5"  (1.651 m)   Wt 170 lb 8 oz (77.3 kg)   LMP 09/05/2022 (Approximate) Comment: heavy bleeding; lasts 7 days  Breastfeeding No   BMI 28.37 kg/m  Physical Exam Vitals and nursing note reviewed. Exam conducted with a chaperone present.  Constitutional:      General: She is not in acute distress.    Appearance: Normal appearance. She is well-developed and normal weight.  HENT:     Head: Normocephalic and atraumatic.  Cardiovascular:     Rate and Rhythm: Normal rate and regular rhythm.     Heart sounds: No murmur heard. Pulmonary:     Effort: Pulmonary effort is normal. No respiratory distress.     Breath sounds: Normal breath sounds. No wheezing.  Abdominal:     General: Abdomen is flat. Bowel sounds are normal. There is no distension.     Palpations:  Abdomen is soft. There is no mass.     Tenderness: There is no abdominal tenderness. There is no guarding or rebound.  Genitourinary:    General: Normal vulva.     Vagina: Vaginal discharge (small, white, non-odorous) present. No erythema, tenderness or bleeding.     Cervix: No cervical motion tenderness, discharge, friability, lesion, erythema or cervical bleeding.     Uterus: Not enlarged and not tender.      Adnexa:        Right: No mass or tenderness.         Left: No mass or tenderness.    Skin:    General: Skin is warm and dry.     Findings: No erythema.  Neurological:     Mental Status: She is alert and oriented to person, place, and time.  Psychiatric:        Mood and Affect: Mood normal.     Labs and Imaging No results found for this or any previous visit (from the past 24 hour(s)).  No results found.  Health Maintenance Due  Topic Date Due   COVID-19 Vaccine (1) Never done   PAP SMEAR-Modifier  04/07/2022    Labs, imaging and previous visits in Epic and Care Everywhere reviewed  Assessment & Plan:  1. Cervical cancer screening - Cytology - PAP( [place])  2. Women's annual routine gynecological examination  Patient will get results via MyChart Return  to office in 1 year for annual exam or 3 years if pap is normal and PCP is doing annual exams or sooner PRN  Kathlene Cote 09/20/2022 4:09 PM

## 2022-09-22 LAB — CYTOLOGY - PAP
Chlamydia: NEGATIVE
Comment: NEGATIVE
Comment: NEGATIVE
Comment: NEGATIVE
Comment: NORMAL
Diagnosis: UNDETERMINED — AB
High risk HPV: NEGATIVE
Neisseria Gonorrhea: NEGATIVE
Trichomonas: NEGATIVE

## 2022-09-26 ENCOUNTER — Emergency Department (HOSPITAL_BASED_OUTPATIENT_CLINIC_OR_DEPARTMENT_OTHER)
Admission: EM | Admit: 2022-09-26 | Discharge: 2022-09-26 | Disposition: A | Discharge status: Home / Self Care | Payer: Medicaid Other | Attending: Emergency Medicine | Admitting: Emergency Medicine

## 2022-09-26 ENCOUNTER — Other Ambulatory Visit: Payer: Self-pay

## 2022-09-26 DIAGNOSIS — Z1152 Encounter for screening for COVID-19: Secondary | ICD-10-CM | POA: Diagnosis not present

## 2022-09-26 DIAGNOSIS — R197 Diarrhea, unspecified: Secondary | ICD-10-CM | POA: Insufficient documentation

## 2022-09-26 DIAGNOSIS — R112 Nausea with vomiting, unspecified: Secondary | ICD-10-CM

## 2022-09-26 LAB — COMPREHENSIVE METABOLIC PANEL
ALT: 17 U/L (ref 0–44)
AST: 19 U/L (ref 15–41)
Albumin: 4.4 g/dL (ref 3.5–5.0)
Alkaline Phosphatase: 35 U/L — ABNORMAL LOW (ref 38–126)
Anion gap: 15 (ref 5–15)
BUN: 13 mg/dL (ref 6–20)
CO2: 21 mmol/L — ABNORMAL LOW (ref 22–32)
Calcium: 8.8 mg/dL — ABNORMAL LOW (ref 8.9–10.3)
Chloride: 103 mmol/L (ref 98–111)
Creatinine, Ser: 0.56 mg/dL (ref 0.44–1.00)
GFR, Estimated: >60 mL/min (ref >60)
Glucose, Bld: 89 mg/dL (ref 70–99)
Potassium: 3.5 mmol/L (ref 3.5–5.1)
Sodium: 139 mmol/L (ref 135–145)
Total Bilirubin: 0.4 mg/dL (ref 0.3–1.2)
Total Protein: 7.7 g/dL (ref 6.5–8.1)

## 2022-09-26 LAB — URINALYSIS, ROUTINE W REFLEX MICROSCOPIC
Bacteria, UA: NONE SEEN
Bilirubin Urine: NEGATIVE
Glucose, UA: NEGATIVE mg/dL
Ketones, ur: >80 mg/dL — AB
Leukocytes,Ua: NEGATIVE
Nitrite: NEGATIVE
Protein, ur: 30 mg/dL — AB
RBC / HPF: >50 RBC/hpf — ABNORMAL HIGH (ref 0–5)
Specific Gravity, Urine: 1.036 — ABNORMAL HIGH (ref 1.005–1.030)
pH: 6.5 (ref 5.0–8.0)

## 2022-09-26 LAB — RESP PANEL BY RT-PCR (RSV, FLU A&B, COVID)  RVPGX2
Influenza A by PCR: NEGATIVE
Influenza B by PCR: NEGATIVE
Resp Syncytial Virus by PCR: NEGATIVE
SARS Coronavirus 2 by RT PCR: NEGATIVE

## 2022-09-26 LAB — LIPASE, BLOOD: Lipase: 21 U/L (ref 11–51)

## 2022-09-26 LAB — CBC
HCT: 38 % (ref 36.0–46.0)
Hemoglobin: 12.2 g/dL (ref 12.0–15.0)
MCH: 25.8 pg — ABNORMAL LOW (ref 26.0–34.0)
MCHC: 32.1 g/dL (ref 30.0–36.0)
MCV: 80.5 fL (ref 80.0–100.0)
Platelets: 305 10*3/uL (ref 150–400)
RBC: 4.72 MIL/uL (ref 3.87–5.11)
RDW: 12.9 % (ref 11.5–15.5)
WBC: 8.8 10*3/uL (ref 4.0–10.5)
nRBC: 0 % (ref 0.0–0.2)

## 2022-09-26 LAB — PREGNANCY, URINE: Preg Test, Ur: NEGATIVE

## 2022-09-26 LAB — CBG MONITORING, ED: Glucose-Capillary: 85 mg/dL (ref 70–99)

## 2022-09-26 MED ORDER — SODIUM CHLORIDE 0.9 % IV BOLUS
1000.0000 mL | Freq: Once | INTRAVENOUS | Status: AC
  Administered 2022-09-26: 1000 mL via INTRAVENOUS

## 2022-09-26 MED ORDER — MORPHINE SULFATE (PF) 4 MG/ML IV SOLN
4.0000 mg | Freq: Once | INTRAVENOUS | Status: AC
  Administered 2022-09-26: 4 mg via INTRAVENOUS
  Filled 2022-09-26: qty 1

## 2022-09-26 MED ORDER — ONDANSETRON HCL 4 MG/2ML IJ SOLN
4.0000 mg | Freq: Once | INTRAMUSCULAR | Status: AC
  Administered 2022-09-26: 4 mg via INTRAVENOUS
  Filled 2022-09-26: qty 2

## 2022-09-26 MED ORDER — ONDANSETRON 4 MG PO TBDP: ORAL_TABLET | ORAL | 0 refills | Status: DC

## 2022-09-26 NOTE — ED Provider Notes (Addendum)
[facility] EMERGENCY DEPT Provider Note   CSN: [id number] Arrival date & time: 09/26/22  1828     History  Chief Complaint  Patient presents with   Fatigue    Caroline Gaines is a 31 y.o. female.  31 yo F with a chief complaints of nausea vomiting diarrhea going on for about 4 days.  She has been coughing and congested as well.  She is really worried because she has not been to keep anything down and just feels very fatigued.  Her son was sick with something similar but is now feeling better.  Denies recent travel denies suspicious food intake.        Home Medications Prior to Admission medications   Medication Sig Start Date End Date Taking? Authorizing Provider  ondansetron (ZOFRAN-ODT) 4 MG disintegrating tablet 4mg  ODT q4 hours prn nausea/vomit 09/26/22  Yes 09/28/22, DO  atorvastatin (LIPITOR) 40 MG tablet Take 40 mg by mouth at bedtime. 09/06/22   [provider]  Biotin 1 MG CAPS Take 1 tablet by mouth once. Patient not taking: Reported on 09/20/2022    [provider]  cholecalciferol (VITAMIN D3) 25 MCG (1000 UNIT) tablet Take 1,000 Units by mouth daily.    [provider]  drospirenone-ethinyl estradiol (YASMIN 28) 3-0.03 MG tablet Take 1 tablet by mouth daily. Patient not taking: Reported on 09/08/2022 07/21/22   Anyanwu, 07/23/22, MD  DRYSOL 20 % external solution Apply topically 2 (two) times a week. 08/30/22   [provider]  Multiple Vitamin (MULTIVITAMIN WITH MINERALS) TABS tablet Take 1 tablet by mouth daily.    [provider]  norgestimate-ethinyl estradiol (ORTHO-CYCLEN) 0.25-35 MG-MCG tablet Take 1 tablet by mouth daily. 09/08/22   14/7/23, MD      Allergies    Escitalopram and Sertraline    Review of Systems   Review of Systems  Physical Exam Updated Vital Signs BP (!) 79/50   Pulse 98   Temp 99.5 F (37.5 C) (Temporal)   Resp 20   Ht 5\' 5"  (1.651 m)   Wt 74.8 kg   LMP  09/05/2022 (Approximate) Comment: heavy bleeding; lasts 7 days  SpO2 100%   BMI 27.46 kg/m  Physical Exam Vitals and nursing note reviewed.  Constitutional:      General: She is not in acute distress.    Appearance: She is well-developed. She is not diaphoretic.  HENT:     Head: Normocephalic and atraumatic.  Eyes:     Pupils: Pupils are equal, round, and reactive to light.  Cardiovascular:     Rate and Rhythm: Normal rate and regular rhythm.     Heart sounds: No murmur heard.    No friction rub. No gallop.  Pulmonary:     Effort: Pulmonary effort is normal.     Breath sounds: No wheezing or rales.  Abdominal:     General: There is no distension.     Palpations: Abdomen is soft.     Tenderness: There is no abdominal tenderness.  Musculoskeletal:        General: No tenderness.     Cervical back: Normal range of motion and neck supple.  Skin:    General: Skin is warm and dry.  Neurological:     Mental Status: She is alert and oriented to person, place, and time.  Psychiatric:        Behavior: Behavior normal.     ED Results / Procedures / Treatments   Labs (all  labs ordered are listed, but only abnormal results are displayed) Labs Reviewed  COMPREHENSIVE METABOLIC PANEL - Abnormal; Notable for the following components:      Result Value   CO2 21 (*)    Calcium 8.8 (*)    Alkaline Phosphatase 35 (*)    All other components within normal limits  CBC - Abnormal; Notable for the following components:   MCH 25.8 (*)    All other components within normal limits  URINALYSIS, ROUTINE W REFLEX MICROSCOPIC - Abnormal; Notable for the following components:   Specific Gravity, Urine 1.036 (*)    Hgb urine dipstick LARGE (*)    Ketones, ur >80 (*)    Protein, ur 30 (*)    RBC / HPF >50 (*)    All other components within normal limits  RESP PANEL BY RT-PCR (RSV, FLU A&B, COVID)  RVPGX2  LIPASE, BLOOD  PREGNANCY, URINE  CBG MONITORING, ED    EKG None  Radiology No  results found.  Procedures Procedures    Medications Ordered in ED Medications  morphine (PF) 4 MG/ML injection 4 mg (4 mg Intravenous Given 09/26/22 1938)  ondansetron (ZOFRAN) injection 4 mg (4 mg Intravenous Given 09/26/22 1937)  sodium chloride 0.9 % bolus 1,000 mL (1,000 mLs Intravenous New Bag/Given 09/26/22 1935)    ED Course/ Medical Decision Making/ A&P                           Medical Decision Making Amount and/or Complexity of Data Reviewed Labs: ordered.  Risk Prescription drug management.   31 yo F with a chief complaints of nausea vomiting diarrhea.  Going on for about 4 days.  Benign abdominal exam.  Blood work here without concerning finding LFTs unremarkable lipase negative COVID flu negative.  UA with significant ketones.  Will give a bolus of IV fluids.  Treat pain and nausea.  Reassess.  Patient does feel much better on repeat assessment.  Tolerating by mouth.  LFTs and lipase are unremarkable.  COVID flu RSV negative.  Will discharge home.  PCP follow-up.  8:25 PM:  I have discussed the diagnosis/risks/treatment options with the patient and family.  Evaluation and diagnostic testing in the emergency department does not suggest an emergent condition requiring admission or immediate intervention beyond what has been performed at this time.  They will follow up with PCP. We also discussed returning to the ED immediately if new or worsening sx occur. We discussed the sx which are most concerning (e.g., sudden worsening pain, fever, inability to tolerate by mouth) that necessitate immediate return. Medications administered to the patient during their visit and any new prescriptions provided to the patient are listed below.  Medications given during this visit Medications  morphine (PF) 4 MG/ML injection 4 mg (4 mg Intravenous Given 09/26/22 1938)  ondansetron (ZOFRAN) injection 4 mg (4 mg Intravenous Given 09/26/22 1937)  sodium chloride 0.9 % bolus 1,000 mL (1,000  mLs Intravenous New Bag/Given 09/26/22 1935)     The patient appears reasonably screen and/or stabilized for discharge and I doubt any other medical condition or other [facility] requiring further screening, evaluation, or treatment in the ED at this time prior to discharge.          Final Clinical Impression(s) / ED Diagnoses Final diagnoses:  Nausea vomiting and diarrhea    Rx / DC Orders ED Discharge Orders          Ordered  ondansetron (ZOFRAN-ODT) 4 MG disintegrating tablet        09/26/22 2021              Deno Etienne, DO 09/26/22 2024    Deno Etienne, DO 09/26/22 2025

## 2022-09-26 NOTE — ED Triage Notes (Addendum)
Arrives ambulatory to triage with complaints of fatigue and chills x4 days. Family is concerned that the patient may be dehydrated.

## 2022-09-26 NOTE — Discharge Instructions (Signed)
Take tylenol 2 pills 4 times a day and motrin 4 pills 3 times a day.  Drink plenty of fluids.  Return for worsening shortness of breath, headache, confusion. Follow up with your family doctor.   

## 2023-02-28 ENCOUNTER — Telehealth: Payer: Self-pay | Admitting: Family Medicine

## 2023-02-28 DIAGNOSIS — N898 Other specified noninflammatory disorders of vagina: Secondary | ICD-10-CM

## 2023-02-28 NOTE — Telephone Encounter (Signed)
Patient is feeling irritation in vaginal area, would like a call back from nurse

## 2023-03-01 MED ORDER — FLUCONAZOLE 150 MG PO TABS: 150.0000 mg | ORAL_TABLET | Freq: Once | ORAL | 0 refills | Status: AC

## 2023-03-01 NOTE — Telephone Encounter (Signed)
I called Caroline Gaines and she confirms she is having vaginal irritation and she thinks it is because she went swimming. She confirms it feels like when she had a yeast infection in the past. She denies vaginal discharge. I informed her I can send in Diflucan and if that does not relieve her symptoms she can make a nurse visit appointment for self swab.  Nancy Fetter

## 2023-04-19 ENCOUNTER — Encounter (HOSPITAL_BASED_OUTPATIENT_CLINIC_OR_DEPARTMENT_OTHER): Payer: Self-pay

## 2023-04-19 ENCOUNTER — Emergency Department (HOSPITAL_BASED_OUTPATIENT_CLINIC_OR_DEPARTMENT_OTHER)
Admission: EM | Admit: 2023-04-19 | Discharge: 2023-04-19 | Disposition: A | Discharge status: Home / Self Care | Payer: Medicaid Other | Attending: Emergency Medicine | Admitting: Emergency Medicine

## 2023-04-19 DIAGNOSIS — R42 Dizziness and giddiness: Secondary | ICD-10-CM | POA: Insufficient documentation

## 2023-04-19 DIAGNOSIS — J45909 Unspecified asthma, uncomplicated: Secondary | ICD-10-CM | POA: Diagnosis not present

## 2023-04-19 DIAGNOSIS — F17200 Nicotine dependence, unspecified, uncomplicated: Secondary | ICD-10-CM | POA: Insufficient documentation

## 2023-04-19 DIAGNOSIS — R519 Headache, unspecified: Secondary | ICD-10-CM | POA: Insufficient documentation

## 2023-04-19 DIAGNOSIS — R112 Nausea with vomiting, unspecified: Secondary | ICD-10-CM | POA: Diagnosis not present

## 2023-04-19 LAB — CBC WITH DIFFERENTIAL/PLATELET
Abs Immature Granulocytes: 0.01 10*3/uL (ref 0.00–0.07)
Basophils Absolute: 0 10*3/uL (ref 0.0–0.1)
Basophils Relative: 1 %
Eosinophils Absolute: 0.2 10*3/uL (ref 0.0–0.5)
Eosinophils Relative: 2 %
HCT: 36.4 % (ref 36.0–46.0)
Hemoglobin: 11.7 g/dL — ABNORMAL LOW (ref 12.0–15.0)
Immature Granulocytes: 0 %
Lymphocytes Relative: 54 %
Lymphs Abs: 4 10*3/uL (ref 0.7–4.0)
MCH: 26.7 pg (ref 26.0–34.0)
MCHC: 32.1 g/dL (ref 30.0–36.0)
MCV: 83.1 fL (ref 80.0–100.0)
Monocytes Absolute: 0.4 10*3/uL (ref 0.1–1.0)
Monocytes Relative: 6 %
Neutro Abs: 2.7 10*3/uL (ref 1.7–7.7)
Neutrophils Relative %: 37 %
Platelets: 304 10*3/uL (ref 150–400)
RBC: 4.38 MIL/uL (ref 3.87–5.11)
RDW: 13.4 % (ref 11.5–15.5)
WBC: 7.4 10*3/uL (ref 4.0–10.5)
nRBC: 0 % (ref 0.0–0.2)

## 2023-04-19 LAB — PREGNANCY, URINE: Preg Test, Ur: NEGATIVE

## 2023-04-19 LAB — COMPREHENSIVE METABOLIC PANEL
ALT: 26 U/L (ref 0–44)
AST: 20 U/L (ref 15–41)
Albumin: 4.5 g/dL (ref 3.5–5.0)
Alkaline Phosphatase: 36 U/L — ABNORMAL LOW (ref 38–126)
Anion gap: 6 (ref 5–15)
BUN: 11 mg/dL (ref 6–20)
CO2: 29 mmol/L (ref 22–32)
Calcium: 9.3 mg/dL (ref 8.9–10.3)
Chloride: 103 mmol/L (ref 98–111)
Creatinine, Ser: 0.55 mg/dL (ref 0.44–1.00)
GFR, Estimated: >60 mL/min (ref >60)
Glucose, Bld: 99 mg/dL (ref 70–99)
Potassium: 3.9 mmol/L (ref 3.5–5.1)
Sodium: 138 mmol/L (ref 135–145)
Total Bilirubin: 0.3 mg/dL (ref 0.3–1.2)
Total Protein: 7.1 g/dL (ref 6.5–8.1)

## 2023-04-19 LAB — URINALYSIS, ROUTINE W REFLEX MICROSCOPIC
Bilirubin Urine: NEGATIVE
Glucose, UA: NEGATIVE mg/dL
Hgb urine dipstick: NEGATIVE
Ketones, ur: NEGATIVE mg/dL
Leukocytes,Ua: NEGATIVE
Nitrite: NEGATIVE
Protein, ur: NEGATIVE mg/dL
Specific Gravity, Urine: 1.025 (ref 1.005–1.030)
pH: 7.5 (ref 5.0–8.0)

## 2023-04-19 MED ORDER — MECLIZINE HCL 25 MG PO TABS
25.0000 mg | ORAL_TABLET | Freq: Once | ORAL | Status: AC
  Administered 2023-04-19: 25 mg via ORAL
  Filled 2023-04-19: qty 1

## 2023-04-19 MED ORDER — MECLIZINE HCL 25 MG PO TABS: 25.0000 mg | ORAL_TABLET | Freq: Three times a day (TID) | ORAL | 0 refills | Status: AC | PRN

## 2023-04-19 MED ORDER — KETOROLAC TROMETHAMINE 60 MG/2ML IM SOLN
30.0000 mg | Freq: Once | INTRAMUSCULAR | Status: AC
  Administered 2023-04-19: 30 mg via INTRAMUSCULAR
  Filled 2023-04-19: qty 2

## 2023-04-19 MED ORDER — ONDANSETRON 4 MG PO TBDP
4.0000 mg | ORAL_TABLET | Freq: Once | ORAL | Status: AC
  Administered 2023-04-19: 4 mg via ORAL
  Filled 2023-04-19: qty 1

## 2023-04-19 MED ORDER — ONDANSETRON 4 MG PO TBDP: 4.0000 mg | ORAL_TABLET | Freq: Three times a day (TID) | ORAL | 0 refills | Status: AC | PRN

## 2023-04-19 NOTE — ED Triage Notes (Signed)
Pt also reports hx migraines

## 2023-04-19 NOTE — ED Triage Notes (Signed)
Pt c/o intermittent dizziness x48hrs. Denies syncope. States she is scheduled for surgery in 75mo, stopped taking escitalopram & atorvastatin & BC.  Hx migraines

## 2023-04-19 NOTE — ED Provider Notes (Signed)
[place] EMERGENCY DEPARTMENT AT [facility] Provider Note  CSN: [id number] Arrival date & time: 04/19/23 2126  Chief Complaint(s) Dizziness  HPI Caroline Gaines is a 32 y.o. female     Dizziness Quality:  Vertigo Severity:  Moderate Duration:  3 days Timing:  Intermittent (episodes last approx 1-2 secs) Progression:  Waxing and waning Chronicity:  New Context: head movement   Context: not with bowel movement, not with ear pain, not with inactivity, not with loss of consciousness and not when standing up   Relieved by:  Being still Worsened by:  Movement and turning head Associated symptoms: headaches (moderate; typical headache), nausea (unrelated to spells) and vomiting (unrelated to spells)   Associated symptoms: no blood in stool, no diarrhea, no palpitations, no shortness of breath, no syncope, no tinnitus, no vision changes and no weakness     Past Medical History Past Medical History:  Diagnosis Date   Alpha thalassemia silent carrier 06/04/2019   SILENT Carrier Alpha-Thal, genetic consult 05/07/2019 FOB: Rickey Barbara 10-10-1988, Alpha thal   Anxiety    Asthma    Depression    Endometriosis    Gestational diabetes    HEADACHE, CHRONIC 11/02/2009   Qualifier: Diagnosis of  By: Vernie Murders     Loud snoring 06/04/2019   Major depressive disorder, recurrent, severe without psychotic features (HCC)    Polysubstance abuse (HCC)    Prediabetes 2023   Pregnancy induced hypertension    Seizures (HCC)    SMOKER 11/02/2009   Qualifier: Diagnosis of  By: Sherene Sires MD, Charlaine Dalton    Trichomoniasis 09/04/2019   toc neg   Patient Active Problem List   Diagnosis Date Noted   Major depressive disorder, recurrent episode, severe (HCC) 04/18/2015   Generalized anxiety disorder 04/18/2015   Asthma 11/02/2009   Home Medication(s) Prior to Admission medications   Medication Sig Start Date End Date Taking? Authorizing Provider  meclizine (ANTIVERT) 25 MG  tablet Take 1 tablet (25 mg total) by mouth 3 (three) times daily as needed for dizziness. 04/19/23  Yes Karyme Mcconathy, Amadeo Garnet, MD  ondansetron (ZOFRAN-ODT) 4 MG disintegrating tablet Take 1 tablet (4 mg total) by mouth every 8 (eight) hours as needed for up to 3 days for nausea or vomiting. 04/19/23 04/22/23 Yes Anely Spiewak, Amadeo Garnet, MD  atorvastatin (LIPITOR) 40 MG tablet Take 40 mg by mouth at bedtime. 09/06/22   [provider]  Biotin 1 MG CAPS Take 1 tablet by mouth once. Patient not taking: Reported on 09/20/2022    [provider]  cholecalciferol (VITAMIN D3) 25 MCG (1000 UNIT) tablet Take 1,000 Units by mouth daily.    [provider]  drospirenone-ethinyl estradiol (YASMIN 28) 3-0.03 MG tablet Take 1 tablet by mouth daily. Patient not taking: Reported on 09/08/2022 07/21/22   Anyanwu, Jethro Bastos, MD  DRYSOL 20 % external solution Apply topically 2 (two) times a week. 08/30/22   [provider]  Multiple Vitamin (MULTIVITAMIN WITH MINERALS) TABS tablet Take 1 tablet by mouth daily.    [provider]  norgestimate-ethinyl estradiol (ORTHO-CYCLEN) 0.25-35 MG-MCG tablet Take 1 tablet by mouth daily. 09/08/22   Lennart Pall, MD  Allergies Escitalopram and Sertraline  Review of Systems Review of Systems  HENT:  Negative for tinnitus.   Respiratory:  Negative for shortness of breath.   Cardiovascular:  Negative for palpitations and syncope.  Gastrointestinal:  Positive for nausea (unrelated to spells) and vomiting (unrelated to spells). Negative for blood in stool and diarrhea.  Neurological:  Positive for dizziness and headaches (moderate; typical headache). Negative for weakness.   As noted in HPI  Physical Exam Vital Signs  I have reviewed the triage vital signs BP (!) 121/95   Pulse 79   Temp 98.4 F  (36.9 C)   Resp (!) 23   SpO2 100%   Physical Exam Vitals reviewed.  Constitutional:      General: She is not in acute distress.    Appearance: She is well-developed. She is not diaphoretic.  HENT:     Head: Normocephalic and atraumatic.     Right Ear: External ear normal.     Left Ear: External ear normal.     Nose: Nose normal.  Eyes:     General: No scleral icterus.    Conjunctiva/sclera: Conjunctivae normal.  Neck:     Trachea: Phonation normal.  Cardiovascular:     Rate and Rhythm: Normal rate and regular rhythm.  Pulmonary:     Effort: Pulmonary effort is normal. No respiratory distress.     Breath sounds: No stridor.  Abdominal:     General: There is no distension.  Musculoskeletal:        General: Normal range of motion.     Cervical back: Normal range of motion.  Neurological:     Mental Status: She is alert and oriented to person, place, and time.     Cranial Nerves: Cranial nerves 2-12 are intact.     Motor: Motor function is intact.     Coordination: Coordination is intact.  Psychiatric:        Behavior: Behavior normal.     ED Results and Treatments Labs (all labs ordered are listed, but only abnormal results are displayed) Labs Reviewed  CBC WITH DIFFERENTIAL/PLATELET - Abnormal; Notable for the following components:      Result Value   Hemoglobin 11.7 (*)    All other components within normal limits  COMPREHENSIVE METABOLIC PANEL - Abnormal; Notable for the following components:   Alkaline Phosphatase 36 (*)    All other components within normal limits  URINALYSIS, ROUTINE W REFLEX MICROSCOPIC  PREGNANCY, URINE                                                                                                                         EKG  EKG Interpretation Date/Time:  Wednesday April 19 2023 23:28:54 EDT Ventricular Rate:  64 PR Interval:  189 QRS Duration:  83 QT Interval:  409 QTC Calculation: 422 R Axis:   81  Text Interpretation: Sinus  rhythm No significant change was found Confirmed by Drema Pry [phone]) on 04/19/2023 11:39:30 PM  Radiology No results found.  Medications Ordered in ED Medications  meclizine (ANTIVERT) tablet 25 mg (25 mg Oral Given 04/19/23 2330)  ketorolac (TORADOL) injection 30 mg (30 mg Intramuscular Given 04/19/23 2331)  ondansetron (ZOFRAN-ODT) disintegrating tablet 4 mg (4 mg Oral Given 04/19/23 2330)   Procedures Procedures  (including critical care time) Medical Decision Making / ED Course   Medical Decision Making Amount and/or Complexity of Data Reviewed Labs: ordered. Decision-making details documented in ED Course.  Risk Prescription drug management.    Intermittent brief dizzy spells worse with movement. Favoring peripheral vertigo. Doubt central process. Labs negative for electrolyte derangements, renal insufficiency, or severe anemia. UPT to rule out pregnancy related process was negative UA negative for infection EKG negative for dysrhythmias or blocks.  DDx consider as above.  Provided with antivert, ODT zofran, and IM toradol.    Final Clinical Impression(s) / ED Diagnoses Final diagnoses:  Dizzy spells   The patient appears reasonably screened and/or stabilized for discharge and I doubt any other medical condition or other [facility] requiring further screening, evaluation, or treatment in the ED at this time. I have discussed the findings, Dx and Tx plan with the patient/family who expressed understanding and agree(s) with the plan. Discharge instructions discussed at length. The patient/family was given strict return precautions who verbalized understanding of the instructions. No further questions at time of discharge.  Disposition: Discharge  Condition: Good  ED Discharge Orders          Ordered    meclizine (ANTIVERT) 25 MG tablet  3 times daily PRN        04/19/23 2345    ondansetron (ZOFRAN-ODT) 4 MG disintegrating tablet  Every 8 hours PRN         04/19/23 2345             Follow Up: Primary care provider  Call  to schedule an appointment for close follow up    This chart was dictated using voice recognition software.  Despite best efforts to proofread,  errors can occur which can change the documentation meaning.    Nira Conn, MD 04/19/23 [phone]

## 2023-08-14 NOTE — Telephone Encounter (Signed)
Open in Error.

## 2023-10-16 ENCOUNTER — Other Ambulatory Visit: Payer: Self-pay | Admitting: Obstetrics & Gynecology

## 2023-10-16 DIAGNOSIS — E349 Endocrine disorder, unspecified: Secondary | ICD-10-CM

## 2023-10-16 DIAGNOSIS — N926 Irregular menstruation, unspecified: Secondary | ICD-10-CM
